# Patient Record
Sex: Female | Born: 1970 | Race: White | Hispanic: No | Marital: Married | Smoking: Former smoker
Health system: Southern US, Community
[De-identification: ages and names within clinical notes are randomized; demographics above are authoritative.]

## PROBLEM LIST (undated history)

## (undated) DIAGNOSIS — N939 Abnormal uterine and vaginal bleeding, unspecified: Secondary | ICD-10-CM

## (undated) DIAGNOSIS — Z973 Presence of spectacles and contact lenses: Secondary | ICD-10-CM

## (undated) HISTORY — PX: ABDOMINAL HYSTERECTOMY: SHX81

## (undated) HISTORY — PX: TUBAL LIGATION: SHX77

---

## 1992-07-20 HISTORY — PX: APPENDECTOMY: SHX54

## 2012-07-20 DIAGNOSIS — S82891A Other fracture of right lower leg, initial encounter for closed fracture: Secondary | ICD-10-CM

## 2012-07-20 HISTORY — DX: Other fracture of right lower leg, initial encounter for closed fracture: S82.891A

## 2012-07-20 HISTORY — PX: ANKLE FRACTURE SURGERY: SHX122

## 2020-01-08 LAB — CBC AND DIFFERENTIAL
HCT: 40 (ref 36–46)
Hemoglobin: 12.5 (ref 12.0–16.0)
Platelets: 264 (ref 150–399)
WBC: 5.2

## 2020-01-08 LAB — CBC: RBC: 4.33 (ref 3.87–5.11)

## 2020-01-08 LAB — TSH: TSH: 2.22 (ref 0.41–5.90)

## 2020-01-18 DIAGNOSIS — Z8616 Personal history of COVID-19: Secondary | ICD-10-CM

## 2020-01-18 HISTORY — DX: Personal history of COVID-19: Z86.16

## 2021-04-01 ENCOUNTER — Encounter: Payer: Self-pay | Admitting: Family Medicine

## 2021-04-01 ENCOUNTER — Ambulatory Visit: Payer: BC Managed Care – PPO | Admitting: Family Medicine

## 2021-04-01 ENCOUNTER — Other Ambulatory Visit: Payer: Self-pay

## 2021-04-01 VITALS — BP 122/72 | HR 76 | Temp 98.3°F | Ht 60.5 in | Wt 144.0 lb

## 2021-04-01 DIAGNOSIS — M533 Sacrococcygeal disorders, not elsewhere classified: Secondary | ICD-10-CM | POA: Diagnosis not present

## 2021-04-01 MED ORDER — CYCLOBENZAPRINE HCL 5 MG PO TABS: 5.0000 mg | ORAL_TABLET | Freq: Every evening | ORAL | 0 refills | Status: DC | PRN

## 2021-04-01 MED ORDER — DICLOFENAC SODIUM 75 MG PO TBEC: 75.0000 mg | DELAYED_RELEASE_TABLET | Freq: Two times a day (BID) | ORAL | 0 refills | Status: DC

## 2021-04-01 NOTE — Progress Notes (Signed)
Primary Care / Sports Medicine Office Visit  Patient Information:  Patient ID: Phyllis Smith, female DOB: March 08, 1971 Age: 50 y.o. MRN: [id number]   Phyllis Smith is a pleasant 50 y.o. 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   cyclobenzaprine (FLEXERIL) 5 MG tablet Take 1-2 tablets (5-10 mg total) by mouth at bedtime as needed for muscle spasms.   diclofenac (VOLTAREN) 75 MG EC tablet Take 1 tablet (75 mg total) by mouth 2 (two) times daily.   ibuprofen (ADVIL) 200 MG tablet Take 800 mg by mouth every 6 (six) hours as needed.   Probiotic Product (PROBIOTIC DAILY PO) Take 1 capsule by mouth daily.   No facility-administered encounter medications on file as of 04/01/2021.   Past Surgical History:  Procedure Laterality Date   ANKLE FRACTURE SURGERY Right 2014   APPENDECTOMY  1994   CESAREAN SECTION  2001   CESAREAN SECTION  2007    Vitals:   04/01/21 1439  BP: 122/72  Pulse: 76  Temp: 98.3 F (36.8 C)  SpO2: 97%   Vitals:   04/01/21 1439  Weight: 144 lb (65.3 kg)  Height: 5' 0.5" (1.537 m)   Body mass index is 27.66 kg/m.  No results found.   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  At onset she does describe both increased physical demands (caring for a family member and decreased regular athletic activity).  She does have a history of low back issues and posterior right hip issues with treatment inclusive of corticosteroid injection and what she localizes as the right sacroiliac joint.  She denies any symptoms traversing of the legs, no paresthesias other than in the gluteal region with prolonged sitting, no bowel/bladder change, and treatments have included activity modification, OTC analgesics.  Physical exam reveals patient in mild distress secondary to discomfort, standing, bilateral negative straight leg raise, mild tenderness right greater than left greater trochanters, right greater than left SI joints, maximal tenderness at the coccyx, nontender gluteus medius and piriformis locations, right side discomfort during FABER.  Her coccydynia can be secondary to repetitive minor trauma, coccygeal bone spurs, instability, degenerative changes, also considered secondary to spinal pathology though given her focal coccygeal tenderness, these can be considered secondary.  Given the persistence of symptoms, plan for dedicated films of the coccyx with stress views, lumbar spine, initiation of scheduled meloxicam, as needed cyclobenzaprine, and gentle activity as symptoms allow.  We will keep close follow-up in 2 weeks for clinical reevaluation and if suboptimal progress noted, escalation of pharmacotherapy, local injections can all be considered.  Once  able to do so, physical therapy recommended.   Orders & Medications Meds ordered this encounter  Medications   cyclobenzaprine (FLEXERIL) 5 MG tablet    Sig: Take 1-2 tablets (5-10 mg total) by mouth at bedtime as needed for muscle spasms.    Dispense:  28 tablet    Refill:  0   diclofenac (VOLTAREN) 75 MG EC tablet     Sig: Take 1 tablet (75 mg total) by mouth 2 (two) times daily.    Dispense:  30 tablet    Refill:  0   Orders Placed This Encounter  Procedures   DG Sacrum/Coccyx   DG Lumbar Spine Complete     Return in about 2 weeks (around 04/15/2021).     [name], MD   Primary Care Sports Medicine [place]

## 2021-04-01 NOTE — Patient Instructions (Signed)
-   Obtain x-rays with order provided - Start diclofenac and dose every a.m. and every p.m. scheduled (take with food, no other NSAIDs while this medication) - Can dose nightly 1-2 tablet cyclobenzaprine (muscle relaxer), side effect can include drowsiness - Continue to perform gentle activity and maintain motion throughout the day using low back/tailbone symptoms as a guide - Return for follow-up in 2 weeks, contact our office for any questions between now and then

## 2021-04-01 NOTE — Assessment & Plan Note (Signed)
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  At onset she does describe both increased physical demands (caring for a family member and decreased regular athletic activity).  She does have a history of low back issues and posterior right hip issues with treatment inclusive of corticosteroid injection and what she localizes as the right sacroiliac joint.  She denies any symptoms traversing of the legs, no paresthesias other than in the gluteal region with prolonged sitting, no bowel/bladder change, and treatments have included activity modification, OTC analgesics.  Physical exam reveals patient in mild distress secondary to discomfort, standing, bilateral negative straight leg raise, mild tenderness right greater than left greater trochanters, right greater than left SI joints, maximal tenderness at the coccyx, nontender gluteus medius and piriformis locations, right side discomfort during FABER.  Her coccydynia can be secondary to repetitive minor trauma, coccygeal bone spurs, instability, degenerative changes, also considered secondary to spinal pathology though given her focal coccygeal tenderness, these can be considered secondary.  Given the persistence of symptoms, plan for dedicated films of the coccyx with stress views, lumbar spine, initiation of scheduled meloxicam, as needed cyclobenzaprine, and gentle activity as symptoms allow.  We will keep close follow-up in 2 weeks for clinical reevaluation and if suboptimal progress noted, escalation of pharmacotherapy, local injections can all be considered.  Once able to do so, physical therapy recommended.

## 2021-04-03 ENCOUNTER — Other Ambulatory Visit: Payer: Self-pay

## 2021-04-03 ENCOUNTER — Ambulatory Visit
Admission: RE | Admit: 2021-04-03 | Discharge: 2021-04-03 | Disposition: A | Discharge status: Home / Self Care | Payer: BC Managed Care – PPO | Attending: Family Medicine | Admitting: Family Medicine

## 2021-04-03 ENCOUNTER — Ambulatory Visit
Admission: RE | Admit: 2021-04-03 | Discharge: 2021-04-03 | Disposition: A | Discharge status: Home / Self Care | Payer: BC Managed Care – PPO | Source: Ambulatory Visit | Attending: Family Medicine | Admitting: Family Medicine

## 2021-04-03 DIAGNOSIS — M545 Low back pain, unspecified: Secondary | ICD-10-CM | POA: Diagnosis not present

## 2021-04-03 DIAGNOSIS — M533 Sacrococcygeal disorders, not elsewhere classified: Secondary | ICD-10-CM | POA: Insufficient documentation

## 2021-04-03 DIAGNOSIS — S332XXA Dislocation of sacroiliac and sacrococcygeal joint, initial encounter: Secondary | ICD-10-CM | POA: Diagnosis not present

## 2021-04-03 DIAGNOSIS — M9914 Subluxation complex (vertebral) of sacral region: Secondary | ICD-10-CM | POA: Diagnosis not present

## 2021-04-03 NOTE — Progress Notes (Signed)
Please advise Ms. Septer that the x-rays do show wear and tear degenerative changes at the low back and some increased motion of the tailbone (coccyx) but not to the point of being abnormal but can still account for your symptoms. For now, continue with current plan outlined at initial visit and that I will see her at follow-up on 04/15/2021. Please have her reach out for questions and ask about MyChart access.

## 2021-04-15 ENCOUNTER — Other Ambulatory Visit: Payer: Self-pay

## 2021-04-15 ENCOUNTER — Ambulatory Visit: Payer: BC Managed Care – PPO | Admitting: Family Medicine

## 2021-04-15 ENCOUNTER — Encounter: Payer: Self-pay | Admitting: Family Medicine

## 2021-04-15 VITALS — BP 98/66 | HR 70 | Ht 60.5 in | Wt 146.0 lb

## 2021-04-15 DIAGNOSIS — M533 Sacrococcygeal disorders, not elsewhere classified: Secondary | ICD-10-CM | POA: Diagnosis not present

## 2021-04-15 DIAGNOSIS — M47817 Spondylosis without myelopathy or radiculopathy, lumbosacral region: Secondary | ICD-10-CM | POA: Diagnosis not present

## 2021-04-15 MED ORDER — DICLOFENAC SODIUM 75 MG PO TBEC: DELAYED_RELEASE_TABLET | ORAL | 0 refills | Status: DC

## 2021-04-15 NOTE — Progress Notes (Signed)
Primary Care / Sports Medicine Office Visit  Patient Information:  Patient ID: Phyllis Smith, female DOB: Mar 29, 1971 Age: 50 y.o. MRN: [id number]   Phyllis Smith is a pleasant 50 y.o. 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   cyclobenzaprine (FLEXERIL) 5 MG tablet Take 1-2 tablets (5-10 mg total) by mouth at bedtime as needed for muscle spasms.   Probiotic Product (ALIGN PO) Take 2 each by mouth daily.   [DISCONTINUED] diclofenac (VOLTAREN) 75 MG EC tablet Take 1 tablet (75 mg total) by mouth 2 (two) times daily.   diclofenac (VOLTAREN) 75 MG EC tablet Take 1 tablet (75 mg total) by mouth 2 (two) times daily for 14 days, THEN 1 tablet (75 mg total) 2 (two) times daily as needed for up to 14 days.   [DISCONTINUED] ibuprofen (ADVIL) 200 MG tablet Take 800 mg by mouth every 6 (six) hours as needed. (Patient not taking: Reported on 04/15/2021)   [DISCONTINUED] Probiotic Product (PROBIOTIC DAILY PO) Take 1 capsule by mouth daily.   No facility-administered encounter medications on file as of 04/15/2021.   Past Surgical History:  Procedure Laterality Date   ANKLE FRACTURE SURGERY Right 2014   APPENDECTOMY  1994   CESAREAN SECTION  2001   CESAREAN SECTION  2007    Vitals:   04/15/21 1322  BP: 98/66  Pulse: 70  SpO2: 98%   Vitals:   04/15/21 1322  Weight: 146  lb (66.2 kg)  Height: 5' 0.5" (1.537 m)   Body mass index is 28.04 kg/m.  DG Lumbar Spine Complete  Result Date: 04/03/2021 CLINICAL DATA:  Acute on chronic low back pain. EXAM: LUMBAR SPINE - COMPLETE 4+ VIEW COMPARISON:  None. FINDINGS: Five lumbar type vertebral bodies. No acute fracture or subluxation. Vertebral body heights are preserved. Trace anterolisthesis at L4-L5. Intervertebral disc spaces are maintained. Severe L4-L5 and moderate L5-S1 facet arthropathy. The sacroiliac joints are unremarkable. IMPRESSION: 1. 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 Electronically Signed   By: Titus Dubin M.D.   On: 04/03/2021 10:29   DG Sacrum/Coccyx  Result Date: 04/03/2021 CLINICAL DATA:  Chronic coccydynia.  Concern for hypermobility. EXAM: SACRUM AND COCCYX - 2+ VIEW COMPARISON:  None. FINDINGS: Mildly increased posterior subluxation of the distal coccyx when sitting compared to standing, measuring 18 degrees. No significant change in flexion of the distal coccyx when sitting. No acute fracture or dislocation. The sacroiliac joints and pubic symphysis are unremarkable. Bone mineralization is normal. Soft tissues are unremarkable. IMPRESSION: 1. Mildly increased posterior subluxation of the distal coccyx when sitting, measuring 18 degrees, which does not meet criteria for hypermobility (>25 degrees). 2. No acute osseous abnormality. Electronically Signed   By: Titus Dubin M.D.   On: 04/03/2021 10:27     Independent interpretation of notes and tests performed by another provider:   Independent interpretation of lumbar and  coccyx x-rays dated 04/03/2021 reveal increased posterior subluxation of the coccyx, lumbar films with facet hypertrophy at L4-5 and L5-S1, grade 1 L4-5 anterolisthesis, no acute osseous processes noted.  Procedures performed:   None  Pertinent History, Exam, Impression, and Recommendations:   Nontraumatic coccydynia Patient is noted interval improvement in her  symptoms following scheduled diclofenac and as needed cyclobenzaprine.  Recent x-rays do reveal motion throughout the coccyx without formal hypermobility, additionally lower lumbar facet arthropathy.  Given her interval improvement I have advised continued diclofenac x2 weeks then transition to as needed dosing, continue as needed dosing of cyclobenzaprine, formal physical therapy, and home exercises.  She is to return for reevaluation in 5 weeks, suboptimal progress noted, escalation of pharmacotherapy versus local ultrasound-guided injection to be considered.  Spondylosis of lumbosacral region without myelopathy or radiculopathy See additional assessment(s) for plan details.    Orders & Medications Meds ordered this encounter  Medications   diclofenac (VOLTAREN) 75 MG EC tablet    Sig: Take 1 tablet (75 mg total) by mouth 2 (two) times daily for 14 days, THEN 1 tablet (75 mg total) 2 (two) times daily as needed for up to 14 days.    Dispense:  60 tablet    Refill:  0   Orders Placed This Encounter  Procedures   Ambulatory referral to Physical Therapy     Return in about 5 weeks (around 05/20/2021) for f/u coccyx, annual physical.     [name], MD   [place]

## 2021-04-15 NOTE — Patient Instructions (Addendum)
-   Start physical therapy, referral coordinator will contact you for scheduling - Continue diclofenac twice daily x2 weeks, after 2 weeks can transition to as needed dosing - Can continue cyclobenzaprine on as-needed basis nightly - Return for follow-up in 5 weeks, contact us for questions between now and then  [facility] Physical Therapy:  [place]:  [phone]

## 2021-04-15 NOTE — Assessment & Plan Note (Signed)
Patient is noted interval improvement in her symptoms following scheduled diclofenac and as needed cyclobenzaprine.  Recent x-rays do reveal motion throughout the coccyx without formal hypermobility, additionally lower lumbar facet arthropathy.  Given her interval improvement I have advised continued diclofenac x2 weeks then transition to as needed dosing, continue as needed dosing of cyclobenzaprine, formal physical therapy, and home exercises.  She is to return for reevaluation in 5 weeks, suboptimal progress noted, escalation of pharmacotherapy versus local ultrasound-guided injection to be considered.

## 2021-04-15 NOTE — Assessment & Plan Note (Signed)
See additional assessment(s) for plan details. 

## 2021-04-22 DIAGNOSIS — D259 Leiomyoma of uterus, unspecified: Secondary | ICD-10-CM | POA: Diagnosis not present

## 2021-04-22 DIAGNOSIS — N939 Abnormal uterine and vaginal bleeding, unspecified: Secondary | ICD-10-CM | POA: Diagnosis not present

## 2021-04-29 ENCOUNTER — Encounter: Payer: Self-pay | Admitting: Physical Therapy

## 2021-04-29 ENCOUNTER — Ambulatory Visit: Payer: BC Managed Care – PPO | Attending: Family Medicine | Admitting: Physical Therapy

## 2021-04-29 DIAGNOSIS — G8929 Other chronic pain: Secondary | ICD-10-CM | POA: Diagnosis not present

## 2021-04-29 DIAGNOSIS — M533 Sacrococcygeal disorders, not elsewhere classified: Secondary | ICD-10-CM | POA: Diagnosis not present

## 2021-04-29 DIAGNOSIS — M545 Low back pain, unspecified: Secondary | ICD-10-CM | POA: Insufficient documentation

## 2021-04-29 NOTE — Therapy (Signed)
[facility] [address] [place], [address] Phone: [phone]   Fax:  [phone]  Physical Therapy Evaluation  Patient Details  Name: Phyllis Smith MRN: [id number] Date of Birth: 03-24-1971 Referring Provider (PT): [name], MD  Encounter Date: 04/29/2021   PT End of Session - 04/29/21 1026     Visit Number 1    Number of Visits 24    Date for PT Re-Evaluation 07/22/21    Authorization Type [employer] reporting from 04/29/2021    Authorization Time Period 30 PT/OT/ST per cal yr    Authorization - Visit Number 1    Authorization - Number of Visits 30    Progress Note Due on Visit 10    PT Start Time 0903    PT Stop Time 1000    PT Time Calculation (min) 57 min    Activity Tolerance Patient tolerated treatment well    Behavior During Therapy [facility] for tasks assessed/performed             Past Medical History:  Diagnosis Date   Broken ankle, right, closed, initial encounter 2014    Past Surgical History:  Procedure Laterality Date   ANKLE FRACTURE SURGERY Right 2014   [place]  2007    There were no vitals filed for this visit.    Subjective Assessment - 04/29/21 0914     Subjective Patient states she doesn't know how her condition started. She used to be super active (volleyball, snow skiing, body step aerobics, Programme researcher, broadcasting/film/video). She also had a super active job at [employer]. She moved and got a different job and [place] hit so she wasn't able to go to the gym. She has always had some low back pain and a bit of a pinch at her right neck.Her tailbone just started feeling numb at first and then she started getting sharp pains in her coccyx when she stands up. She got a TM desk (goes up to 2 mph). She felt that this may have helped accommodate the pain but it is still causing problems when she stopped. She was given some  medication that has made it better and she has been able to return to exercising. She states she still avoids things like russian twists. She went mountain bike riding last week and was okay for about 20 min (she was able to go 20 more min). She still has pain and limitations but it is much better after the medication. She does not want to have to take the medication forever. She states she has fallen on her tailbone "a million times" over her lifetime but nothing recent. She states her low back pain has gotten worse but it does seem separate from her coccyx pain for her. She denies any radiation down the legs. Denies numbness tingling except initially numbness over her tailbone. Coccyx pain does not wake her. She had a steroid injection to the low back (S1? Per patient), which helped for a couple of months. Would like guidance on what to do. States there is a lot of info on instagram but she doesn't know what to follow. Feels limited in exercise. She has 2 large dogs.   She has very irregular periods. Is getting a pelvic ultrasound about 2 weeks from now. Denies leaking of urine of bowel or difficulty with voiding/defecating. Denies pain with sex.    Pertinent  History Patient is a 50 y.o. female who presents to outpatient physical therapy with a referral for medical diagnosis nontraumatic coccydynia, spondylosis of lumbosacral region without myelopathy or radiculopathy. This patient's chief complaints consist of pain at the coccyx and low back pain R > L that radiates into her glutes leading to the following functional deficits: difficulty with sitting in kayak for more than 10 min, russian twists, mountain bike for more than 20 min, sleeping, prolonged sitting, prolonged standing/walking,  working in seated position, getting up after sitting, cardio, jumping, etc. Patient reports decreased quality of life and 25# weight gain that she attributes to her impairments and functional deficits.   Relevant past medical  history and comorbidities include former smoker, prior ankle fracture with surgery R (2014).  Patient denies hx of cancer, stroke, seizures, lung problem, major cardiac events, diabetes, unexplained weight loss, new onset stumbling or dropping things, osteoporosis, spinal surgery.    Limitations Sitting;Standing;Walking;House hold activities;Other (comment)   sitting in kayak, russian twists, mountain bike, sleeping, prolonged sitting, prolonged standing/walking,  working in seated position, getting up after sitting, cardio, jumping, etc. Patient reports decreased quality of life and 25# weight gain   Diagnostic tests Sacrum/Coccyx xray report 04/03/2021: "IMPRESSION:  1. Mildly increased posterior subluxation of the distal coccyx when  sitting, measuring 18 degrees, which does not meet criteria for  hypermobility (>25 degrees).  2. No acute osseous abnormality."  Lumbar xray report 04/03/2021: "IMPRESSION:  1. Lower lumbar facet arthropathy, severe at L4-L5."    Patient Stated Goals Want better mobility, to learn what to do when it does hurt    Currently in Pain? Yes    Pain Score 3    Coccyx: C: 3/10, W: 8/10, B: 2-3/10; Low back: C: 3-4/10, W: 7/10, B: 2/10.   Pain Location Other (Comment)   coccyx and bilateral buttocks/glutes and posterolateral trunk (R > L for low back pain)   Pain Orientation Right;Left;Mid;Lower    Pain Descriptors / Indicators Other (Comment)   Coccyx: numbnes, sharp pain; low back: dull pain, constant   Pain Type Chronic pain    Pain Radiating Towards does not ratiate    Pain Onset More than a month ago    Pain Frequency --   coccyx: intermittant, low back constant   Aggravating Factors  Coccyx: prolonged sitting, when she goes to sit up, laying on back, riding bike, kayaking, any pressure on it. Low back: when she wakes up and by end of day after usual activities, prolonged standing. Both: jumping.    Pain Relieving Factors Coccyx: standing, getting off of it, medication. Low  back: sitting down, heating pad, steroid shot (for 2 months).    Effect of Pain on Daily Activities Functional Limitations: sitting in kayak for more than 10 min, russian twists, mountain bike for more than 20 min, sleeping, prolonged sitting, prolonged standing/walking,  working in seated position, getting up after sitting, cardio, jumping, etc. Patient reports decreased quality of life and 25# weight gain that she attributes to her impairments and functional deficits.              [facility] PT Assessment - 04/29/21 0001       Assessment   Medical Diagnosis nontraumatic coccydynia, spondylosis of lumbosacral region without myelopathy or radiculopathy    Referring Provider (PT) [name], MD    Onset Date/Surgical Date --   coccyx has been really bad the last year, low back more chronic   Prior Therapy none for this  problem prior to current episode of care      Precautions   Precautions None      Balance Screen   Has the patient fallen in the past 6 months No    Has the patient had a decrease in activity level because of a fear of falling?  No    Is the patient reluctant to leave their home because of a fear of falling?  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 Shifts around frequently.  Posture  (standing): increased lumbar lordosis.  Anthropometrics Tremor: none Body composition: WFL Muscle bulk: WFL Edema: none Functional Mobility Bed mobility: supine <> sit and rolling I with some modifications to avoid pressure to coccyx Transfers: sit <> stand mod I for increased time due to pain with release of pressure to coccyx.  Gait: grossly WFL for household and short community ambulation. More detailed gait analysis deferred to later date as needed.  SPINE MOTION Lumbar Spine AROM Flexion: fingers to floor, normal range, no pain Extension: 50%, no pain Side Flexion:   R WFL  L range WFL, right sided low back pain Rotation:  R 75% L 75%  PERIPHERAL JOINT MOTION (in degrees) Passive Range of Motion (PROM) B LE WFL, hip flexion, IR, ER trending towards hypermobile  MUSCLE PERFORMANCE (MMT):  *Indicates pain 04/29/21 Date Date  Joint/Motion R/L R/L R/L  Hip     Flexion (L1, L2) 4+/5 / /  Extension (knee ext) 4+*/5* / /  Extension (knee flex) 4*/4* / /  Abduction 5/5 / /  Adduction 4/4+ / /  External rotation (prone) 4/4+ / /  Internal rotation (prone) 4/4+ / /  Comments:  04/29/21: B knee and ankle 5/5. able to heel and toe walk with no UE support. Pain near right ischial tuberosity with R hip adduction. Pain with hip extension at ipsilateral glute.   SPECIAL TESTS:  LOWER LIMB NEURODYNAMICS Straight Leg Raise (Sciatic nerve)  R  = negative  L  = negative  HIP SPECIAL TESTS FABER: R = negative, L = negative.  SIJ SPECIAL TESTS SIJ distraction: R = negative, L = negative. SIJ compression: R = negative, L = negative. Thigh thrust: R = deferred, L = deferred. Sacral thrust: R = negative, L = negative  ACCESSORY MOTION:  CPA at sacrum reproduces coccyx pain mildly.  CPA at coccyx strongly reproduce coccyx pain.   PALPATION: No reproduction of pain with palpation to B SIJ.  Mildly tender throughout L posterior hips/glutes and bilateral lumbar paraspinals, QL,  and glute med region.  Concordant tenderness for "low back pain" in right glute region near sciatic notch and deep external hip rotators.  Strong concordant coccyx pain with palpation near and  over coccyx. No concordant pain on medial side of ischium bilaterally.     REPEATED MOTIONS TESTING: Prone press up 1x10, no significant change in symptoms.   Objective measurements completed on examination: See above findings.    TREATMENT:   Therapeutic exercise: to centralize symptoms and improve ROM, strength, muscular endurance, and activity tolerance required for successful completion of functional activities.  - prone press up 1x10 - quadruped rock back 1x5, with diagonal flair, 1x5 each side.  - Education on diagnosis, prognosis, POC, anatomy and physiology of current condition.  - education on coccyx cushion for sitting  Pt required multimodal cuing for proper technique and to facilitate improved neuromuscular control, strength, range of motion, and functional ability resulting in improved performance and form.  HOME EXERCISE PROGRAM Access Code: [email or web address] URL: [email or web address] Date: 04/29/2021 Prepared by: Rosita Kea  Exercises Prone Press Up - 4 x daily - 1 sets - 15 reps  Verbally delivered:  [email or web address] [place] back -  Repeat 10 Times, Hold 5 Seconds, Complete 1 Set, Perform 2 Times a Day    PT Education - 04/29/21 1026     Education Details Exercise purpose/form. Self management techniques. Education on diagnosis, prognosis, POC, anatomy and physiology of current condition Education on HEP including handout    Person(s) Educated Patient    Methods Explanation;Demonstration;Tactile cues;Verbal cues;Handout    Comprehension Verbalized understanding;Returned demonstration;Verbal cues required;Tactile cues required;Need further instruction              PT Short Term Goals - 04/29/21 1031       PT SHORT TERM GOAL #1   Title Be  independent with initial home exercise program for self-management of symptoms.    Baseline initial HEP provided at IE (04/29/2021);    Time 2    Period Weeks    Status New    Target Date 05/13/21               PT Long Term Goals - 04/29/21 1032       PT LONG TERM GOAL #1   Title Be independent with a long-term home exercise program for self-management of symptoms.    Baseline Initial HEP provided at IE (04/29/2021);    Time 12    Period Weeks    Status New   TARGET DATE FOR ALL LONG TERM GOALS: 07/22/2021     PT LONG TERM GOAL #2   Title Demonstrate improved FOTO to equal or greater than 70 by visit #10 to demonstrate improvement in overall condition and self-reported functional ability.    Baseline 57 (04/29/2021);    Time 12    Period Weeks    Status New      PT LONG TERM GOAL #3   Title Reduce pain with functional activities to equal or less than 3/10 to allow patient to complete usual activities including exercising, walking, and sitting  with less difficulty.    Baseline 8/10 (04/29/21);    Time 12    Period Weeks    Status New      PT LONG TERM GOAL #4   Title Improve B hip strength to equal or greater than 4+/5 with no increase in pain to improve patient's ability to complete functional tasks such as exercising, sitting, and jumping with less difficulty.    Baseline some movements painful and as weak as 4/5 (04/29/2021);    Time 12    Period Weeks    Status New  PT LONG TERM GOAL #5   Title Complete community, work and/or recreational activities without limitation due to current condition    Baseline Functional Limitations: sitting in kayak for more than 10 min, russian twists, mountain bike for more than 20 min, sleeping, prolonged sitting, prolonged standing/walking,  working in seated position, getting up after sitting, cardio, jumping,  (04/29/2021);    Time 12    Period Weeks    Status New                    Plan - 04/29/21 1041      Clinical Impression Statement Patient is a 50 y.o. female referred to outpatient physical therapy with a medical diagnosis of nontraumatic coccydynia, spondylosis of lumbosacral region without myelopathy or radiculopathy who presents with signs and symptoms consistent with chronic coccydynia and low back pain with likely somatic radiation to the R > L buttocks. Screening for pelvic floor cause of coccydynia negative today but patient may benefit from strengthening/motor control exercise for pelvic floor to improve pain at coccyx. Patient presents with significant pain, muscle tension, motor control, posture, joint stiffness, ROM, muscle performance (strength/power/endurance) and activity tolerance impairments that are limiting ability to complete her usual activities including sitting in kayak, russian twists, mountain bike, sleeping, prolonged sitting, prolonged standing/walking,  working in seated position, getting up after sitting, cardio, jumping, etc without difficulty. These difficulties decrease patient's quality of life and she reports they have lead to 25# weight gain. Patient will benefit from skilled physical therapy intervention to address current body structure impairments and activity limitations to improve function and work towards goals set in current POC in order to return to prior level of function or maximal functional improvement.    Personal Factors and Comorbidities Comorbidity 3+;Past/Current Experience;Time since onset of injury/illness/exacerbation    Comorbidities former smoker, prior ankle fracture with surgery R (2014), right neck pain.    Examination-Activity Limitations Bed Mobility;Squat;Locomotion Level;Stand;Transfers;Sit;Sleep    Examination-Participation Restrictions Driving;Occupation;Community Activity;Interpersonal Relationship;Other   sitting in kayak, russian twists, mountain bike, sleeping, prolonged sitting, prolonged standing/walking,  working in seated position,  getting up after sitting, cardio, jumping, etc. Patient reports decreased quality of life and 25# weight gain   Stability/Clinical Decision Making Stable/Uncomplicated    Clinical Decision Making Low    Rehab Potential Good    PT Frequency 2x / week    PT Duration 12 weeks    PT Treatment/Interventions ADLs/Self Care Home Management;Aquatic Therapy;Cryotherapy;Moist Heat;Electrical Stimulation;Manual techniques;Dry needling;Passive range of motion;Neuromuscular re-education;Therapeutic exercise;Therapeutic activities;Patient/family education;Spinal Manipulations;Joint Manipulations    PT Next Visit Plan update HEP as appropriate, core, hip, and funcitonal strengthening as tolerated.    PT Home Exercise Plan Medbridge Access Code: [email or web address]               [email or web address]    Consulted and Agree with Plan of Care Patient             Patient will benefit from skilled therapeutic intervention in order to improve the following deficits and impairments:  Pain, Impaired sensation, Decreased mobility, Increased muscle spasms, Decreased activity tolerance, Decreased endurance, Decreased range of motion, Decreased strength, Hypomobility, Impaired perceived functional ability, Difficulty walking  Visit Diagnosis: Sacrococcygeal disorders, not elsewhere classified  Chronic bilateral low back pain without sciatica     Problem List Patient Active Problem List   Diagnosis Date Noted   Spondylosis of lumbosacral region without myelopathy or radiculopathy 04/15/2021   Nontraumatic coccydynia 04/01/2021   Everlean Alstrom. Graylon Good, PT,  DPT 04/29/21, 10:45 AM  [facility] [address] [place], [address] Phone: [phone]   Fax:  [phone]  Name: Shirlette Scarber MRN: [id number] Date of Birth: Jan 09, 1971

## 2021-05-01 ENCOUNTER — Telehealth: Payer: Self-pay | Admitting: Physical Therapy

## 2021-05-01 ENCOUNTER — Ambulatory Visit: Payer: BC Managed Care – PPO | Admitting: Physical Therapy

## 2021-05-01 NOTE — Telephone Encounter (Signed)
Called patient when she did not show up for her appointment scheduled at 9am today. Patient answered and stated she did not realize she had an appointment today. She checked her printed schedule and confirmed she would come to next scheduled appt Tues 05/06/2021 at 9:00am. She was unable to reschedule to another open appointment time today.   Everlean Alstrom. Graylon Good, PT, DPT 05/01/21, 9:46 AM

## 2021-05-06 ENCOUNTER — Encounter: Payer: Self-pay | Admitting: Physical Therapy

## 2021-05-06 ENCOUNTER — Ambulatory Visit: Payer: BC Managed Care – PPO | Admitting: Physical Therapy

## 2021-05-06 DIAGNOSIS — M533 Sacrococcygeal disorders, not elsewhere classified: Secondary | ICD-10-CM | POA: Diagnosis not present

## 2021-05-06 DIAGNOSIS — G8929 Other chronic pain: Secondary | ICD-10-CM

## 2021-05-06 DIAGNOSIS — M545 Low back pain, unspecified: Secondary | ICD-10-CM | POA: Diagnosis not present

## 2021-05-06 NOTE — Therapy (Signed)
[facility] [address], [place], [address] Phone: [phone]   Fax:  [phone]  Physical Therapy Treatment  Patient Details  Name: Phyllis Smith MRN: [id number] Date of Birth: 1971-02-20 Referring Provider (PT): [name], MD   Encounter Date: 05/06/2021   PT End of Session - 05/06/21 1133     Visit Number 2    Number of Visits 24    Date for PT Re-Evaluation 07/22/21    Authorization Type BLUE [employer] reporting from 04/29/2021    Authorization Time Period 30 PT/OT/ST per cal yr    Authorization - Visit Number 2    Authorization - Number of Visits 30    Progress Note Due on Visit 10    PT Start Time 0900    PT Stop Time 0945    PT Time Calculation (min) 45 min    Activity Tolerance Patient tolerated treatment well    Behavior During Therapy [facility] for tasks assessed/performed             Past Medical History:  Diagnosis Date   Broken ankle, right, closed, initial encounter 2014    Past Surgical History:  Procedure Laterality Date   ANKLE FRACTURE SURGERY Right 2014   [place]  2007    There were no vitals filed for this visit.   Subjective Assessment - 05/06/21 0903     Subjective Patient reports her coccyx feels about the same. States she is not sure if she is doing the "butt in the air" exercise and cannot tell that it is helping. States the prone press up is helpful to the back pain at least temporarily and she does it about 1 x a day. Reports current pain 3/10 over the coccyx. She denies leaking if she sneezes, jumps, etc. She denies repeated trips to the bathroom or multiple trips at night to urinate.    Pertinent History Patient is a 50 y.o. female who presents to outpatient physical therapy with a referral for medical diagnosis nontraumatic coccydynia, spondylosis of lumbosacral region without myelopathy or  radiculopathy. This patient's chief complaints consist of pain at the coccyx and low back pain R > L that radiates into her glutes leading to the following functional deficits: difficulty with sitting in kayak for more than 10 min, russian twists, mountain bike for more than 20 min, sleeping, prolonged sitting, prolonged standing/walking,  working in seated position, getting up after sitting, cardio, jumping, etc. Patient reports decreased quality of life and 25# weight gain that she attributes to her impairments and functional deficits.   Relevant past medical history and comorbidities include former smoker, prior ankle fracture with surgery R (2014).  Patient denies hx of cancer, stroke, seizures, lung problem, major cardiac events, diabetes, unexplained weight loss, new onset stumbling or dropping things, osteoporosis, spinal surgery.    Limitations Sitting;Standing;Walking;House hold activities;Other (comment)   sitting in kayak, russian twists, mountain bike, sleeping, prolonged sitting, prolonged standing/walking,  working in seated position, getting up after sitting, cardio, jumping, etc. Patient reports decreased quality of life and 25# weight gain   Diagnostic tests Sacrum/Coccyx xray report 04/03/2021: "IMPRESSION:  1. Mildly increased posterior subluxation of the distal coccyx when  sitting, measuring 18 degrees, which does not meet criteria for  hypermobility (>25 degrees).  2. No acute osseous abnormality."  Lumbar xray report 04/03/2021: "IMPRESSION:  1. Lower lumbar facet arthropathy, severe  at L4-L5."    Patient Stated Goals Want better mobility, to learn what to do when it does hurt    Pain Score 3     Pain Onset More than a month ago               TREATMENT:    Therapeutic exercise: to centralize symptoms and improve ROM, strength, muscular endurance, and activity tolerance required for successful completion of functional activities.  - education on role of pelvic floor, relationship  to core, diaphragm - education on sitting postures and use of coccyx cushion with visual examples.  - education on graded exposure to sitting.  - Quadruped bird dog (alternating shoulder flexion/contralateral hip extension with core muscles braced). Cuing for abdominal brace and to stabilize core. 2x10 each side with instruction on how to progress with UE/LE circles.  - sidelying diaphragmatic breathing with hand on chest and umbilicus to improve form, 1x10 breaths (pt improved with cuing but does have difficulty with coordination).  - standing diaphragmatic breathing with hand on chest and umbilicus to improve form, 1x10 breaths (pt improved with cuing but does have difficulty with coordination).  - seated figure 4 glute stretch, 1x5 seconds each side (Feels good) - sidelying left reverse clam shell, 1x10 to learn how to perform.  - quadruped hip hinge rock back with hips in slight IR to stretch pelvic floor and hips, 1x10 to learn technique (does not report pain at anterior hips).  - Education on HEP including handout    Manual therapy: to reduce pain and tissue tension, improve range of motion, neuromodulation, in order to promote improved ability to complete functional activities. PRONE with face in table cradle, pillow under ankles - CPA grade III-IV with KE wedge 10-30 reps per segment, T10-L5.  - STM to right deep hip external rotators and glutes with trigger point release   Pt required multimodal cuing for proper technique and to facilitate improved neuromuscular control, strength, range of motion, and functional ability resulting in improved performance and form.   HOME EXERCISE PROGRAM Access Code: [email or web address] URL: [email or web address] Date: 05/06/2021 Prepared by: Rosita Kea  Exercises Prone Press Up - 4 x daily - 1 sets - 15 reps Sidelying Diaphragmatic Breathing - 1-2 x daily - 20 reps Standing Diaphragmatic Breathing - 1-2 x daily - 20 reps Hip Hinge Rock Back  - 1-2 x daily - 1 sets - 20 reps - 5 seconds hold Bird Dog - 1 x daily - 3 sets - 10 reps Seated Hip External Rotation Stretch - 1 x daily - 3 sets - 30 seconds hold Sidelying Reverse Clamshell - 1 x daily - 3 sets - 10 reps     PT Education - 05/06/21 1132     Education Details - education on role of pelvic floor, relationship to core, diaphragm  - education on sitting postures and use of coccyx cushion with visual examples.   - education on graded exposure to sitting. - Education on HEP including handout.  Educated on return to functional activity and meaning of pain with movement in regards in when to stop or continue. Education on diagnosis, prognosis, POC, anatomy and physiology of current condition    Person(s) Educated Patient    Methods Explanation;Demonstration;Tactile cues;Verbal cues;Handout    Comprehension Verbalized understanding;Returned demonstration;Verbal cues required;Tactile cues required;Need further instruction              PT Short Term Goals - 04/29/21 1031       PT SHORT TERM  GOAL #1   Title Be independent with initial home exercise program for self-management of symptoms.    Baseline initial HEP provided at IE (04/29/2021);    Time 2    Period Weeks    Status New    Target Date 05/13/21               PT Long Term Goals - 04/29/21 1032       PT LONG TERM GOAL #1   Title Be independent with a long-term home exercise program for self-management of symptoms.    Baseline Initial HEP provided at IE (04/29/2021);    Time 12    Period Weeks    Status New   TARGET DATE FOR ALL LONG TERM GOALS: 07/22/2021     PT LONG TERM GOAL #2   Title Demonstrate improved FOTO to equal or greater than 70 by visit #10 to demonstrate improvement in overall condition and self-reported functional ability.    Baseline 57 (04/29/2021);    Time 12    Period Weeks    Status New      PT LONG TERM GOAL #3   Title Reduce pain with functional activities to equal or less  than 3/10 to allow patient to complete usual activities including exercising, walking, and sitting  with less difficulty.    Baseline 8/10 (04/29/21);    Time 12    Period Weeks    Status New      PT LONG TERM GOAL #4   Title Improve B hip strength to equal or greater than 4+/5 with no increase in pain to improve patient's ability to complete functional tasks such as exercising, sitting, and jumping with less difficulty.    Baseline some movements painful and as weak as 4/5 (04/29/2021);    Time 12    Period Weeks    Status New      PT LONG TERM GOAL #5   Title Complete community, work and/or recreational activities without limitation due to current condition    Baseline Functional Limitations: sitting in kayak for more than 10 min, russian twists, mountain bike for more than 20 min, sleeping, prolonged sitting, prolonged standing/walking,  working in seated position, getting up after sitting, cardio, jumping,  (04/29/2021);    Time 12    Period Weeks    Status New                   Plan - 05/06/21 1131     Clinical Impression Statement Patient tolerated treatment well overall with no increase in pain by end of session. Patient continues to have tenderness/tightness at right deep hip external rotators to palpation. May benefit from dry needling to this region next session (educated on technique this session). Patient introduced to diaphragmatic breathing with plan to progress to coordinating with with pelvic floor drop/relaxation to decrease strain on coccyx form pelvic floor. Sitting modifications discussed and pt educated on graded approach to improving sitting tolerance. Educated on return to functional activity and meaning of pain with movement in regards in when to stop or continue. HEP updated with well tolerated exercises that help address back and coccyx pain. Patient would benefit from continued management of limiting condition by skilled physical therapist to address  remaining impairments and functional limitations to work towards stated goals and return to PLOF or maximal functional independence.    Personal Factors and Comorbidities Comorbidity 3+;Past/Current Experience;Time since onset of injury/illness/exacerbation    Comorbidities former smoker, prior ankle fracture with surgery  R (2014), right neck pain.    Examination-Activity Limitations Bed Mobility;Squat;Locomotion Level;Stand;Transfers;Sit;Sleep    Examination-Participation Restrictions Driving;Occupation;Community Activity;Interpersonal Relationship;Other   sitting in kayak, russian twists, mountain bike, sleeping, prolonged sitting, prolonged standing/walking,  working in seated position, getting up after sitting, cardio, jumping, etc. Patient reports decreased quality of life and 25# weight gain   Stability/Clinical Decision Making Stable/Uncomplicated    Rehab Potential Good    PT Frequency 2x / week    PT Duration 12 weeks    PT Treatment/Interventions ADLs/Self Care Home Management;Aquatic Therapy;Cryotherapy;Moist Heat;Electrical Stimulation;Manual techniques;Dry needling;Passive range of motion;Neuromuscular re-education;Therapeutic exercise;Therapeutic activities;Patient/family education;Spinal Manipulations;Joint Manipulations    PT Next Visit Plan update HEP as appropriate, core, hip, and funcitonal strengthening as tolerated. consider dry needling and manual    PT Home Exercise Plan Medbridge Access Code: [email or web address]               [email or web address]    Consulted and Agree with Plan of Care Patient             Patient will benefit from skilled therapeutic intervention in order to improve the following deficits and impairments:  Pain, Impaired sensation, Decreased mobility, Increased muscle spasms, Decreased activity tolerance, Decreased endurance, Decreased range of motion, Decreased strength, Hypomobility, Impaired perceived functional ability, Difficulty walking  Visit  Diagnosis: Sacrococcygeal disorders, not elsewhere classified  Chronic bilateral low back pain without sciatica     Problem List Patient Active Problem List   Diagnosis Date Noted   Spondylosis of lumbosacral region without myelopathy or radiculopathy 04/15/2021   Nontraumatic coccydynia 04/01/2021    Everlean Alstrom. Graylon Good, PT, DPT 05/06/21, 11:34 AM  [facility] [address], [place], [address] Phone: [phone]   Fax:  [phone]  Name: Phyllis Smith MRN: [id number] Date of Birth: 12-May-1971

## 2021-05-07 ENCOUNTER — Ambulatory Visit: Payer: Self-pay | Admitting: Internal Medicine

## 2021-05-08 ENCOUNTER — Encounter: Payer: BC Managed Care – PPO | Admitting: Physical Therapy

## 2021-05-13 ENCOUNTER — Encounter: Payer: BC Managed Care – PPO | Admitting: Physical Therapy

## 2021-05-15 ENCOUNTER — Encounter: Payer: BC Managed Care – PPO | Admitting: Physical Therapy

## 2021-05-15 DIAGNOSIS — R9389 Abnormal findings on diagnostic imaging of other specified body structures: Secondary | ICD-10-CM | POA: Diagnosis not present

## 2021-05-15 DIAGNOSIS — N939 Abnormal uterine and vaginal bleeding, unspecified: Secondary | ICD-10-CM | POA: Diagnosis not present

## 2021-05-15 DIAGNOSIS — N9489 Other specified conditions associated with female genital organs and menstrual cycle: Secondary | ICD-10-CM | POA: Diagnosis not present

## 2021-05-15 DIAGNOSIS — N858 Other specified noninflammatory disorders of uterus: Secondary | ICD-10-CM | POA: Diagnosis not present

## 2021-05-15 DIAGNOSIS — D259 Leiomyoma of uterus, unspecified: Secondary | ICD-10-CM | POA: Diagnosis not present

## 2021-05-15 HISTORY — PX: ENDOMETRIAL BIOPSY: SHX622

## 2021-05-19 ENCOUNTER — Ambulatory Visit: Payer: BC Managed Care – PPO | Admitting: Physical Therapy

## 2021-05-19 ENCOUNTER — Encounter: Payer: Self-pay | Admitting: Physical Therapy

## 2021-05-19 DIAGNOSIS — G8929 Other chronic pain: Secondary | ICD-10-CM | POA: Diagnosis not present

## 2021-05-19 DIAGNOSIS — M533 Sacrococcygeal disorders, not elsewhere classified: Secondary | ICD-10-CM | POA: Diagnosis not present

## 2021-05-19 DIAGNOSIS — M545 Low back pain, unspecified: Secondary | ICD-10-CM | POA: Diagnosis not present

## 2021-05-19 NOTE — Therapy (Signed)
[facility] [address] [place], [address] Phone: [phone]   Fax:  [phone]  Physical Therapy Treatment  Patient Details  Name: Phyllis Smith MRN: [id number] Date of Birth: October 01, 1970 Referring Provider (PT): [name], MD   Encounter Date: 05/19/2021   PT End of Session - 05/19/21 1335     Visit Number 3    Number of Visits 24    Date for PT Re-Evaluation 07/22/21    Authorization Type BL[employer]IELD reporting from 04/29/2021    Authorization Time Period 30 PT/OT/ST per cal yr    Authorization - Visit Number 3    Authorization - Number of Visits 30    Progress Note Due on Visit 10    PT Start Time 0902    PT Stop Time 0942    PT Time Calculation (min) 40 min    Activity Tolerance Patient tolerated treatment well    Behavior During Therapy [facility] for tasks assessed/performed             Past Medical History:  Diagnosis Date   Broken ankle, right, closed, initial encounter 2014    Past Surgical History:  Procedure Laterality Date   ANKLE FRACTURE SURGERY Right 2014   [place]  2007    There were no vitals filed for this visit.   Subjective Assessment - 05/19/21 0902     Subjective Pateint reports her coccyx pain is about the same. She went on a trip and caught a cold and tested negative at home for [place] but she stayed away from PT just in case. Her trip was emotionally stressfull and she thinks she just got worn down. She worked out with a [employer] video on Saturday and she feels "good sore." She tolerated the workout well as long as she is standing. She found the dipahragmatic breathing difficult. She went on a hike and tried to do diaphragmatic breathing but felt she was doing it wrong. Reports current pain in the coccyx 2/10 when she is sitting in the car (feels it but doesn't feel like she needs to move). She  is still taking the anti-inflammatory. Has not tried the coccyx cushion. Felt buttocks was tender where manual therapy was performed and felt like it was bruised. Cannot tell if manual therapy did or did not help her coccyx.    Pertinent History Patient is a 50 y.o. female who presents to outpatient physical therapy with a referral for medical diagnosis nontraumatic coccydynia, spondylosis of lumbosacral region without myelopathy or radiculopathy. This patient's chief complaints consist of pain at the coccyx and low back pain R > L that radiates into her glutes leading to the following functional deficits: difficulty with sitting in kayak for more than 10 min, russian twists, mountain bike for more than 20 min, sleeping, prolonged sitting, prolonged standing/walking,  working in seated position, getting up after sitting, cardio, jumping, etc. Patient reports decreased quality of life and 25# weight gain that she attributes to her impairments and functional deficits.   Relevant past medical history and comorbidities include former smoker, prior ankle fracture with surgery R (2014).  Patient denies hx of cancer, stroke, seizures, lung problem, major cardiac events, diabetes, unexplained weight loss, new onset stumbling or dropping things, osteoporosis, spinal surgery.    Limitations Sitting;Standing;Walking;House hold activities;Other (comment)   sitting in kayak, russian twists, mountain bike, sleeping, prolonged sitting,  prolonged standing/walking,  working in seated position, getting up after sitting, cardio, jumping, etc. Patient reports decreased quality of life and 25# weight gain   Diagnostic tests Sacrum/Coccyx xray report 04/03/2021: "IMPRESSION:  1. Mildly increased posterior subluxation of the distal coccyx when  sitting, measuring 18 degrees, which does not meet criteria for  hypermobility (>25 degrees).  2. No acute osseous abnormality."  Lumbar xray report 04/03/2021: "IMPRESSION:  1. Lower lumbar facet  arthropathy, severe at L4-L5."    Patient Stated Goals Want better mobility, to learn what to do when it does hurt    Currently in Pain? Yes    Pain Score 2     Pain Onset More than a month ago              TREATMENT:    Therapeutic exercise: to centralize symptoms and improve ROM, strength, muscular endurance, and activity tolerance required for successful completion of functional activities.  - discussed use of dry needling for condition (pt elects to think about it).  - standing diaphragmatic breathing with hand on chest and umbilicus to improve form, 1x10 breaths  - quadruped diaphragmatic breathing, 1x10 breaths (attempted with rock back but unable to coordinate).  - modified hip airplanes with hands on hips and toe on floor, 2x10 each side.  - lateral stepping with GTB around ankles, 1x30 feet each direction - standing lumbar extension, 1x10 - forward/backwards monster walks, with GTB around ankles 1x30 feet each direction - standing pallof press, 2x10 each side with GTB (medium).  - Education on HEP including handout    Pt required multimodal cuing for proper technique and to facilitate improved neuromuscular control, strength, range of motion, and functional ability resulting in improved performance and form.   HOME EXERCISE PROGRAM Access Code: [email or web address] URL: [email or web address] Date: 05/06/2021 Prepared by: Rosita Kea   Exercises Prone Press Up - 4 x daily - 1 sets - 15 reps Sidelying Diaphragmatic Breathing - 1-2 x daily - 20 reps Standing Diaphragmatic Breathing - 1-2 x daily - 20 reps Hip Hinge Rock Back - 1-2 x daily - 1 sets - 20 reps - 5 seconds hold Bird Dog - 1 x daily - 3 sets - 10 reps Seated Hip External Rotation Stretch - 1 x daily - 3 sets - 30 seconds hold Sidelying Reverse Clamshell - 1 x daily - 3 sets - 10 reps   [email or web address] [email or web address]   hip airplane, -  Repeat 10 Times, Complete 3 Sets, Perform 1 Times a Day    PT Education -  05/19/21 1335     Education Details Exercise purpose/form. Self management techniques. handout on diaphragmatic breathing and updated HEP    Person(s) Educated Patient    Methods Explanation;Demonstration;Tactile cues;Verbal cues;Handout    Comprehension Verbalized understanding;Returned demonstration;Verbal cues required;Tactile cues required;Need further instruction              PT Short Term Goals - 05/19/21 1340       PT SHORT TERM GOAL #1   Title Be independent with initial home exercise program for self-management of symptoms.    Baseline initial HEP provided at IE (04/29/2021);    Time 2    Period Weeks    Status Achieved    Target Date 05/13/21               PT Long Term Goals - 04/29/21 1032       PT LONG TERM GOAL #1   Title Be independent with a long-term home  exercise program for self-management of symptoms.    Baseline Initial HEP provided at IE (04/29/2021);    Time 12    Period Weeks    Status New   TARGET DATE FOR ALL LONG TERM GOALS: 07/22/2021     PT LONG TERM GOAL #2   Title Demonstrate improved FOTO to equal or greater than 70 by visit #10 to demonstrate improvement in overall condition and self-reported functional ability.    Baseline 57 (04/29/2021);    Time 12    Period Weeks    Status New      PT LONG TERM GOAL #3   Title Reduce pain with functional activities to equal or less than 3/10 to allow patient to complete usual activities including exercising, walking, and sitting  with less difficulty.    Baseline 8/10 (04/29/21);    Time 12    Period Weeks    Status New      PT LONG TERM GOAL #4   Title Improve B hip strength to equal or greater than 4+/5 with no increase in pain to improve patient's ability to complete functional tasks such as exercising, sitting, and jumping with less difficulty.    Baseline some movements painful and as weak as 4/5 (04/29/2021);    Time 12    Period Weeks    Status New      PT LONG TERM GOAL #5    Title Complete community, work and/or recreational activities without limitation due to current condition    Baseline Functional Limitations: sitting in kayak for more than 10 min, russian twists, mountain bike for more than 20 min, sleeping, prolonged sitting, prolonged standing/walking,  working in seated position, getting up after sitting, cardio, jumping,  (04/29/2021);    Time 12    Period Weeks    Status New                   Plan - 05/19/21 1339     Clinical Impression Statement Patient tolerated treatment well with no increase in pain during exercises. Session focused on improving breathing coordination and hip muscle performance to improve activity tolerance and stability at the pelvis and core. Patient presents with DOMS from workout yesterday that she felt during today's session. Patient continues to have coccyx pain that limits her functional activities, mainly those that involve sitting.  Patient would benefit from continued management of limiting condition by skilled physical therapist to address remaining impairments and functional limitations to work towards stated goals and return to PLOF or maximal functional independence.    Personal Factors and Comorbidities Comorbidity 3+;Past/Current Experience;Time since onset of injury/illness/exacerbation    Comorbidities former smoker, prior ankle fracture with surgery R (2014), right neck pain.    Examination-Activity Limitations Bed Mobility;Squat;Locomotion Level;Stand;Transfers;Sit;Sleep    Examination-Participation Restrictions Driving;Occupation;Community Activity;Interpersonal Relationship;Other   sitting in kayak, russian twists, mountain bike, sleeping, prolonged sitting, prolonged standing/walking,  working in seated position, getting up after sitting, cardio, jumping, etc. Patient reports decreased quality of life and 25# weight gain   Stability/Clinical Decision Making Stable/Uncomplicated    Rehab Potential Good    PT  Frequency 2x / week    PT Duration 12 weeks    PT Treatment/Interventions ADLs/Self Care Home Management;Aquatic Therapy;Cryotherapy;Moist Heat;Electrical Stimulation;Manual techniques;Dry needling;Passive range of motion;Neuromuscular re-education;Therapeutic exercise;Therapeutic activities;Patient/family education;Spinal Manipulations;Joint Manipulations    PT Next Visit Plan update HEP as appropriate, core, hip, and funcitonal strengthening as tolerated. consider dry needling and manual    PT [place]  Access Code: [email or web address]               [email or web address] [email or web address]    Consulted and Agree with Plan of Care Patient             Patient will benefit from skilled therapeutic intervention in order to improve the following deficits and impairments:  Pain, Impaired sensation, Decreased mobility, Increased muscle spasms, Decreased activity tolerance, Decreased endurance, Decreased range of motion, Decreased strength, Hypomobility, Impaired perceived functional ability, Difficulty walking  Visit Diagnosis: Sacrococcygeal disorders, not elsewhere classified  Chronic bilateral low back pain without sciatica     Problem List Patient Active Problem List   Diagnosis Date Noted   Spondylosis of lumbosacral region without myelopathy or radiculopathy 04/15/2021   Nontraumatic coccydynia 04/01/2021    Everlean Alstrom. Graylon Good, PT, DPT 05/19/21, 1:40 PM   [facility] [address], [place], [address] Phone: [phone]   Fax:  [phone]  Name: Phyllis Smith MRN: [id number] Date of Birth: 1970-09-30

## 2021-05-20 ENCOUNTER — Other Ambulatory Visit: Payer: Self-pay

## 2021-05-20 ENCOUNTER — Ambulatory Visit (INDEPENDENT_AMBULATORY_CARE_PROVIDER_SITE_OTHER): Payer: BC Managed Care – PPO | Admitting: Family Medicine

## 2021-05-20 ENCOUNTER — Encounter: Payer: Self-pay | Admitting: Family Medicine

## 2021-05-20 VITALS — BP 108/72 | HR 64 | Temp 98.4°F | Ht 60.5 in | Wt 145.0 lb

## 2021-05-20 DIAGNOSIS — Z1159 Encounter for screening for other viral diseases: Secondary | ICD-10-CM | POA: Diagnosis not present

## 2021-05-20 DIAGNOSIS — Z114 Encounter for screening for human immunodeficiency virus [HIV]: Secondary | ICD-10-CM

## 2021-05-20 DIAGNOSIS — E559 Vitamin D deficiency, unspecified: Secondary | ICD-10-CM

## 2021-05-20 DIAGNOSIS — M533 Sacrococcygeal disorders, not elsewhere classified: Secondary | ICD-10-CM

## 2021-05-20 DIAGNOSIS — G8929 Other chronic pain: Secondary | ICD-10-CM | POA: Insufficient documentation

## 2021-05-20 DIAGNOSIS — Z1211 Encounter for screening for malignant neoplasm of colon: Secondary | ICD-10-CM

## 2021-05-20 DIAGNOSIS — Z0000 Encounter for general adult medical examination without abnormal findings: Secondary | ICD-10-CM | POA: Diagnosis not present

## 2021-05-20 DIAGNOSIS — M25571 Pain in right ankle and joints of right foot: Secondary | ICD-10-CM

## 2021-05-20 DIAGNOSIS — Z13220 Encounter for screening for lipoid disorders: Secondary | ICD-10-CM

## 2021-05-20 DIAGNOSIS — Z872 Personal history of diseases of the skin and subcutaneous tissue: Secondary | ICD-10-CM

## 2021-05-20 DIAGNOSIS — M47817 Spondylosis without myelopathy or radiculopathy, lumbosacral region: Secondary | ICD-10-CM

## 2021-05-20 MED ORDER — MELOXICAM 7.5 MG PO TABS: 7.5000 mg | ORAL_TABLET | Freq: Every day | ORAL | 1 refills | Status: DC | PRN

## 2021-05-20 MED ORDER — CYCLOBENZAPRINE HCL 5 MG PO TABS: 5.0000 mg | ORAL_TABLET | Freq: Every evening | ORAL | 1 refills | Status: DC | PRN

## 2021-05-20 NOTE — Assessment & Plan Note (Signed)
Return of right ankle pain in the setting of prior ORIF, will obtain dedicated x-rays and follow results.

## 2021-05-20 NOTE — Assessment & Plan Note (Signed)
Chronic symptomatology that has minimally improved following physical therapy, does note mild improvement/response from NSAIDs and body positioning, as ordered sacral support cushion as mentioned at last visit.  Given her radiographic findings and persistent symptomatology, she may benefit from focal image guided treatments, for this among other management options I have placed a referral to Dr. Holley Raring with pain and spine.

## 2021-05-20 NOTE — Assessment & Plan Note (Signed)
Has been compliant with physical therapy and medications, noting improvement, unfortunate improvement of the lumbosacral spine has not correlated with improvement at her coccyx.  I have encouraged her to continue formal PT, home exercises, consideration of dry needling, and medications as needed.

## 2021-05-20 NOTE — Patient Instructions (Addendum)
-   Obtain fasting labs with orders provided (can have water or black coffee but otherwise no food or drink x 8 hours before labs) - Review information provided - Attend eye doctor annually, dentist every 6 months, work towards or maintain 30 minutes of moderate intensity physical activity at least 5 days per week, and consume a balanced diet - Continue medications on an as-needed basis for tailbone pain - Continue with PT/home exercises - Referral coordinator will contact you for follow-up with dermatology, pain and spine, GI for colonoscopy - Return in 3 months - Contact us for any questions between now and then

## 2021-05-20 NOTE — Assessment & Plan Note (Signed)
Annual examination completed, risk stratification labs ordered, anticipatory guidance provided.  We will follow labs once resulted. Referral to GI for screening colonoscopy placed. Referral to Dermatology for skin health check and history of recurrent sunburns placed.

## 2021-05-20 NOTE — Progress Notes (Signed)
Annual Physical Exam Visit  Patient Information:  Patient ID: Phyllis Smith, female DOB: [birth year] Age: 50 y.o. MRN: [id number]   Subjective:   CC: Annual Physical Exam  HPI:  Phyllis Smith is here for their annual physical.  I reviewed the past medical history, family history, social history, surgical history, and allergies today and changes were made as necessary.  Please see the problem list section below for additional details.  Past Medical History: Past Medical History:  Diagnosis Date   Broken ankle, right, closed, initial encounter 2014   Past Surgical History: Past Surgical History:  Procedure Laterality Date   ANKLE FRACTURE SURGERY Right 2014   [name]  2001   CESAREAN SECTION  2007   ENDOMETRIAL BIOPSY  05/15/2021   Family History: Family History  Problem Relation Age of Onset   Mesothelioma Mother    Cancer Father    Gallbladder disease Father    Heart disease Maternal Grandmother    Allergies: No Known Allergies Health Maintenance: Health Maintenance  Topic Date Due   COVID-19 Vaccine (1) Never done   Pneumococcal Vaccine 64-74 Years old (1 - PCV) Never done   Hepatitis C Screening  Never done   PAP SMEAR-Modifier  Never done   COLONOSCOPY (Pts 45-41yrs Insurance coverage will need to be confirmed)  Never done   INFLUENZA VACCINE  10/17/2021 (Originally 02/17/2021)   TETANUS/TDAP  05/20/2022 (Originally 03/18/1992)   HIV Screening  Completed   HPV VACCINES  Aged Out    HM Colonoscopy          Overdue - COLONOSCOPY (Pts 45-32yrs Insurance coverage will need to be confirmed) (Every 10 Years) Overdue - never done    No completion history exists for this topic.           Medications: Current Outpatient Medications on File Prior to Visit  Medication Sig Dispense Refill   acetaminophen (TYLENOL) 500 MG tablet Take 1,000 mg by mouth every 6 (six) hours as needed.     Probiotic Product (ALIGN PO) Take 2 each by mouth  daily.     No current facility-administered medications on file prior to visit.    Review of Systems: No headache, visual changes, nausea, vomiting, diarrhea, constipation, dizziness, abdominal pain, skin rash, fevers, chills, night sweats, swollen lymph nodes, weight loss, chest pain, body aches, + joint pain, joint swelling, muscle aches, shortness of breath, mood changes, visual or auditory hallucinations reported.  Objective:   Vitals:   05/20/21 0946  BP: 108/72  Pulse: 64  Temp: 98.4 F (36.9 C)  SpO2: 98%   Vitals:   05/20/21 0946  Weight: 145 lb (65.8 kg)  Height: 5' 0.5" (1.537 m)   Body mass index is 27.85 kg/m.  General: Well Developed, well nourished, and in no acute distress.  Neuro: Alert and oriented x3, extra-ocular muscles intact, sensation grossly intact. Cranial nerves II through XII are intact, motor, sensory, and coordinative functions are all intact. HEENT: Normocephalic, atraumatic, pupils equal round reactive to light, neck supple, no masses, no lymphadenopathy, thyroid nonpalpable. Oropharynx, nasopharynx, external ear canals are unremarkable. Skin: Warm and dry, no rashes noted.  Cardiac: Regular rate and rhythm, no murmurs rubs or gallops. No peripheral edema. Pulses symmetric. Respiratory: Clear to auscultation bilaterally. Not using accessory muscles, speaking in full sentences.  Abdominal: Soft, nontender, nondistended, positive bowel sounds, no masses, no organomegaly. Musculoskeletal: Shoulder, elbow, wrist, hip, knee, ankle stable, and with full range of motion.  Tenderness at the lateral right ankle, no crepitations, well-healed surgical scar longitudinally noted  Female chaperone initials: BN present throughout the physical examination.  Impression and Recommendations:   The patient was counselled, risk factors were discussed, and anticipatory guidance given.  Annual physical exam Annual examination completed, risk stratification labs  ordered, anticipatory guidance provided.  We will follow labs once resulted. Referral to GI for screening colonoscopy placed. Referral to Dermatology for skin health check and history of recurrent sunburns placed.  Nontraumatic coccydynia Chronic symptomatology that has minimally improved following physical therapy, does note mild improvement/response from NSAIDs and body positioning, as ordered sacral support cushion as mentioned at last visit.  Given her radiographic findings and persistent symptomatology, she may benefit from focal image guided treatments, for this among other management options I have placed a referral to Dr. Holley Raring with pain and spine.  Spondylosis of lumbosacral region without myelopathy or radiculopathy Has been compliant with physical therapy and medications, noting improvement, unfortunate improvement of the lumbosacral spine has not correlated with improvement at her coccyx.  I have encouraged her to continue formal PT, home exercises, consideration of dry needling, and medications as needed.  Chronic pain of right ankle Return of right ankle pain in the setting of prior ORIF, will obtain dedicated x-rays and follow results.  Orders & Medications Medications:  Meds ordered this encounter  Medications   cyclobenzaprine (FLEXERIL) 5 MG tablet    Sig: Take 1-2 tablets (5-10 mg total) by mouth at bedtime as needed for muscle spasms.    Dispense:  60 tablet    Refill:  1   meloxicam (MOBIC) 7.5 MG tablet    Sig: Take 1 tablet (7.5 mg total) by mouth daily as needed for pain.    Dispense:  30 tablet    Refill:  1   Orders Placed This Encounter  Procedures   DG Ankle Complete Right   DG Foot Complete Right   TSH Rfx on Abnormal to Free T4   Lipid panel   Comprehensive metabolic panel   CBC with Differential/Platelet   VITAMIN D 25 Hydroxy (Vit-D Deficiency, Fractures)   Apo A1 + B + Ratio   Hepatitis C Antibody   HIV antibody (with reflex)   Ambulatory referral  to Gastroenterology   Ambulatory referral to Anesthesiology   Ambulatory referral to Dermatology     Return in about 3 months (around 08/20/2021).    [name], MD   [facility] [place]

## 2021-05-21 ENCOUNTER — Ambulatory Visit: Payer: BC Managed Care – PPO | Attending: Family Medicine | Admitting: Physical Therapy

## 2021-05-21 ENCOUNTER — Encounter: Payer: Self-pay | Admitting: Physical Therapy

## 2021-05-21 DIAGNOSIS — G8929 Other chronic pain: Secondary | ICD-10-CM | POA: Insufficient documentation

## 2021-05-21 DIAGNOSIS — M533 Sacrococcygeal disorders, not elsewhere classified: Secondary | ICD-10-CM | POA: Insufficient documentation

## 2021-05-21 DIAGNOSIS — M545 Low back pain, unspecified: Secondary | ICD-10-CM | POA: Diagnosis not present

## 2021-05-21 NOTE — Therapy (Signed)
[facility] [address] [place], [address] Phone: [phone]   Fax:  [phone]  Physical Therapy Treatment  Patient Details  Name: Phyllis Smith MRN: [id number] Date of Birth: [birth year] Referring Provider (PT): [name], MD   Encounter Date: 05/21/2021   PT End of Session - 05/21/21 1039     Visit Number 4    Number of Visits 24    Date for PT Re-Evaluation 07/22/21    Authorization Type [employer] reporting from 04/29/2021    Authorization Time Period 30 PT/OT/ST per cal yr    Authorization - Visit Number 4    Authorization - Number of Visits 30    Progress Note Due on Visit 10    PT Start Time 0902    PT Stop Time 0942    PT Time Calculation (min) 40 min    Activity Tolerance Patient tolerated treatment well    Behavior During Therapy [facility] for tasks assessed/performed             Past Medical History:  Diagnosis Date   Broken ankle, right, closed, initial encounter 2014    Past Surgical History:  Procedure Laterality Date   ANKLE FRACTURE SURGERY Right 2014   [place]  2007   ENDOMETRIAL BIOPSY  05/15/2021    There were no vitals filed for this visit.   Subjective Assessment - 05/21/21 0905     Subjective Patient reports she saw her PCP yesterday and was discussing her pain with her. Her PCP reccomended she try dry needling and going to 1x a week. Pateint reports she is very uncomfortable with the mask in the clinic. Reports her coccyx pain has been the same but her back is improving. She reports no pain upon arrival unless she sits directly on her tail bone. States she ordered a coccyx pillow. Is able to do the diaphragmatic breathing but it is awkward. Driving here was not super painful. She is being referred to a clinic that can do injections. She is also taking less anti-inflammatory medications. She has been  able to increase her sitting time tolerance.    Pertinent History Patient is a 50 y.o. female who presents to outpatient physical therapy with a referral for medical diagnosis nontraumatic coccydynia, spondylosis of lumbosacral region without myelopathy or radiculopathy. This patient's chief complaints consist of pain at the coccyx and low back pain R > L that radiates into her glutes leading to the following functional deficits: difficulty with sitting in kayak for more than 10 min, russian twists, mountain bike for more than 20 min, sleeping, prolonged sitting, prolonged standing/walking,  working in seated position, getting up after sitting, cardio, jumping, etc. Patient reports decreased quality of life and 25# weight gain that she attributes to her impairments and functional deficits.   Relevant past medical history and comorbidities include former smoker, prior ankle fracture with surgery R (2014).  Patient denies hx of cancer, stroke, seizures, lung problem, major cardiac events, diabetes, unexplained weight loss, new onset stumbling or dropping things, osteoporosis, spinal surgery.    Limitations Sitting;Standing;Walking;House hold activities;Other (comment)   sitting in kayak, russian twists, mountain bike, sleeping, prolonged sitting, prolonged standing/walking,  working in seated position, getting up after sitting, cardio, jumping, etc. Patient reports decreased quality of life and 25# weight gain   Diagnostic tests Sacrum/Coccyx xray report 04/03/2021: "IMPRESSION:  1. Mildly increased  posterior subluxation of the distal coccyx when  sitting, measuring 18 degrees, which does not meet criteria for  hypermobility (>25 degrees).  2. No acute osseous abnormality."  Lumbar xray report 04/03/2021: "IMPRESSION:  1. Lower lumbar facet arthropathy, severe at L4-L5."    Patient Stated Goals Want better mobility, to learn what to do when it does hurt    Currently in Pain? No/denies    Pain Onset More than a  month ago                TREATMENT:    Therapeutic exercise: to centralize symptoms and improve ROM, strength, muscular endurance, and activity tolerance required for successful completion of functional activities.  - seated pelvic floor contraction progressing to relaxation with education on how to perform in coordination with diaphragmatic breathing to emphasize pelvic floor relaxation. Instructed on how to feel for proper pelvic floor contraction.  ( Manual therapy / Dry needling - see below).  - modified hip airplanes with hands on hips and toe on floor, 2x10 each side.  - lateral stepping with GTB around ankles, 1x30 feet each direction  - Education on HEP including handout    Pt required multimodal cuing for proper technique and to facilitate improved neuromuscular control, strength, range of motion, and functional ability resulting in improved performance and form.  Manual therapy: to reduce pain and tissue tension, improve range of motion, neuromodulation, in order to promote improved ability to complete functional activities. PRONE/SIDELYING - STM to lumbar paraspinals R > L - STM to bilateral deep hip external rotators and glute muscles  Modality: (unbilled) Dry needling performed to bilateral hip deep external rotators to decrease pain and spasms along patient's coccyx and glute region with patient in sidelying and prone utilizing (2) dry needle(s) .44mm x 123mm with (3) sticks at right deep rotators and (1) stick at left deep rotators. Patient educated about the risks and benefits from therapy and verbally consents to treatment.  Dry needling performed by Everlean Alstrom. Graylon Good PT, DPT who is certified in this technique.     HOME EXERCISE PROGRAM Access Code: [email or web address] URL: [email or web address] Date: 05/21/2021 Prepared by: Rosita Kea  Exercises Prone Press Up - 4 x daily - 1 sets - 15 reps Sidelying Diaphragmatic Breathing - 1-2 x daily - 20 reps Standing  Diaphragmatic Breathing - 1-2 x daily - 20 reps Hip Hinge Rock Back - 1-2 x daily - 1 sets - 20 reps - 5 seconds hold Bird Dog - 1 x daily - 3 sets - 10 reps Seated Hip External Rotation Stretch - 1 x daily - 3 sets - 30 seconds hold Sidelying Reverse Clamshell - 1 x daily - 3 sets - 10 reps Diaphragmatic Breathing in Supported Child's Pose with Pelvic Floor Relaxation - 1-2 x daily - 5 minutes practice time    [email or web address] [email or web address]    hip airplane, -  Repeat 10 Times, Complete 3 Sets, Perform 1 Times a Day    PT Education - 05/21/21 1039     Education Details Exercise purpose/form. Self management techniques. handout on diaphragmatic breathing with pelvic floor relaxation HEP    Person(s) Educated Patient    Methods Explanation;Demonstration;Tactile cues;Verbal cues;Handout    Comprehension Verbalized understanding;Returned demonstration;Verbal cues required;Tactile cues required;Need further instruction              PT Short Term Goals - 05/19/21 1340       PT SHORT TERM GOAL #1   Title Be independent with initial home  exercise program for self-management of symptoms.    Baseline initial HEP provided at IE (04/29/2021);    Time 2    Period Weeks    Status Achieved    Target Date 05/13/21               PT Long Term Goals - 04/29/21 1032       PT LONG TERM GOAL #1   Title Be independent with a long-term home exercise program for self-management of symptoms.    Baseline Initial HEP provided at IE (04/29/2021);    Time 12    Period Weeks    Status New   TARGET DATE FOR ALL LONG TERM GOALS: 07/22/2021     PT LONG TERM GOAL #2   Title Demonstrate improved FOTO to equal or greater than 70 by visit #10 to demonstrate improvement in overall condition and self-reported functional ability.    Baseline 57 (04/29/2021);    Time 12    Period Weeks    Status New      PT LONG TERM GOAL #3   Title Reduce pain with functional activities to equal or less than 3/10 to allow  patient to complete usual activities including exercising, walking, and sitting  with less difficulty.    Baseline 8/10 (04/29/21);    Time 12    Period Weeks    Status New      PT LONG TERM GOAL #4   Title Improve B hip strength to equal or greater than 4+/5 with no increase in pain to improve patient's ability to complete functional tasks such as exercising, sitting, and jumping with less difficulty.    Baseline some movements painful and as weak as 4/5 (04/29/2021);    Time 12    Period Weeks    Status New      PT LONG TERM GOAL #5   Title Complete community, work and/or recreational activities without limitation due to current condition    Baseline Functional Limitations: sitting in kayak for more than 10 min, russian twists, mountain bike for more than 20 min, sleeping, prolonged sitting, prolonged standing/walking,  working in seated position, getting up after sitting, cardio, jumping,  (04/29/2021);    Time 12    Period Weeks    Status New                   Plan - 05/21/21 1043     Clinical Impression Statement Pateint tolerated treatment well overall and continues to report improved back pain. Was TTP in right deep hip external rotator region and demonstrated worse proprioception and neuromuscular control when weight bearing on R LE compared to L LE. Patient does report improvements in sitting tolerance and medication use for coccyx pain and her back pain appears to be improving. PT agrees with reducing visit frequency to 1x  week per patient frequency. Patient would benefit from continued management of limiting condition by skilled physical therapist to address remaining impairments and functional limitations to work towards stated goals and return to PLOF or maximal functional independence.    Personal Factors and Comorbidities Comorbidity 3+;Past/Current Experience;Time since onset of injury/illness/exacerbation    Comorbidities former smoker, prior ankle fracture with  surgery R (2014), right neck pain.    Examination-Activity Limitations Bed Mobility;Squat;Locomotion Level;Stand;Transfers;Sit;Sleep    Examination-Participation Restrictions Driving;Occupation;Community Activity;Interpersonal Relationship;Other   sitting in kayak, russian twists, mountain bike, sleeping, prolonged sitting, prolonged standing/walking,  working in seated position, getting up after sitting, cardio, jumping, etc. Patient reports decreased  quality of life and 25# weight gain   Stability/Clinical Decision Making Stable/Uncomplicated    Rehab Potential Good    PT Frequency 2x / week    PT Duration 12 weeks    PT Treatment/Interventions ADLs/Self Care Home Management;Aquatic Therapy;Cryotherapy;Moist Heat;Electrical Stimulation;Manual techniques;Dry needling;Passive range of motion;Neuromuscular re-education;Therapeutic exercise;Therapeutic activities;Patient/family education;Spinal Manipulations;Joint Manipulations    PT Next Visit Plan update HEP as appropriate, core, hip, and funcitonal strengthening as tolerated. consider dry needling and manual    PT Home Exercise Plan Medbridge Access Code: [email or web address]               [email or web address]    Consulted and Agree with Plan of Care Patient             Patient will benefit from skilled therapeutic intervention in order to improve the following deficits and impairments:  Pain, Impaired sensation, Decreased mobility, Increased muscle spasms, Decreased activity tolerance, Decreased endurance, Decreased range of motion, Decreased strength, Hypomobility, Impaired perceived functional ability, Difficulty walking  Visit Diagnosis: Sacrococcygeal disorders, not elsewhere classified  Chronic bilateral low back pain without sciatica     Problem List Patient Active Problem List   Diagnosis Date Noted   Annual physical exam 05/20/2021   Chronic pain of right ankle 05/20/2021   Spondylosis of lumbosacral region without myelopathy or  radiculopathy 04/15/2021   Nontraumatic coccydynia 04/01/2021    Everlean Alstrom. Graylon Good, PT, DPT 05/21/21, 10:44 AM   [facility] [address] [place], [address] Phone: [phone]   Fax:  [phone]  Name: Laqueta Bonaventura MRN: [id number] Date of Birth: 1970/09/25

## 2021-05-26 ENCOUNTER — Ambulatory Visit: Payer: BC Managed Care – PPO | Admitting: Physical Therapy

## 2021-05-26 ENCOUNTER — Encounter: Payer: Self-pay | Admitting: Physical Therapy

## 2021-05-26 DIAGNOSIS — M533 Sacrococcygeal disorders, not elsewhere classified: Secondary | ICD-10-CM

## 2021-05-26 DIAGNOSIS — M545 Low back pain, unspecified: Secondary | ICD-10-CM

## 2021-05-26 DIAGNOSIS — G8929 Other chronic pain: Secondary | ICD-10-CM | POA: Diagnosis not present

## 2021-05-26 NOTE — Therapy (Signed)
[facility] [address] [place], [address] Phone: [phone]   Fax:  [phone]  Physical Therapy Treatment  Patient Details  Name: Phyllis Smith MRN: [id number] Date of Birth: 01-25-1971 Referring Provider (PT): [name], MD   Encounter Date: 05/26/2021   PT End of Session - 05/26/21 0909     Visit Number 5    Number of Visits 24    Date for PT Re-Evaluation 07/22/21    Authorization Type [employer] reporting from 04/29/2021    Authorization Time Period 30 PT/OT/ST per cal yr    Authorization - Visit Number 5    Authorization - Number of Visits 30    Progress Note Due on Visit 10    PT Start Time 0902    PT Stop Time 0942    PT Time Calculation (min) 40 min    Activity Tolerance Patient tolerated treatment well    Behavior During Therapy [facility] for tasks assessed/performed             Past Medical History:  Diagnosis Date   Broken ankle, right, closed, initial encounter 2014    Past Surgical History:  Procedure Laterality Date   ANKLE FRACTURE SURGERY Right 2014   [place]  2007   ENDOMETRIAL BIOPSY  05/15/2021    There were no vitals filed for this visit.   Subjective Assessment - 05/26/21 0903     Subjective Pateint reports she continues to feel better. Her back feels loose and she cannot feel pain at rest. She thinks her exercises are working and has been practicing her breathing and it helped her. She got her coccyx pillow and used it in the car on her way here. Reports current coxxyc pain 2/10 when she sits on it. She does feel the dry needling helped her.    Pertinent History Patient is a 50 y.o. female who presents to outpatient physical therapy with a referral for medical diagnosis nontraumatic coccydynia, spondylosis of lumbosacral region without myelopathy or radiculopathy. This patient's chief complaints  consist of pain at the coccyx and low back pain R > L that radiates into her glutes leading to the following functional deficits: difficulty with sitting in kayak for more than 10 min, russian twists, mountain bike for more than 20 min, sleeping, prolonged sitting, prolonged standing/walking,  working in seated position, getting up after sitting, cardio, jumping, etc. Patient reports decreased quality of life and 25# weight gain that she attributes to her impairments and functional deficits.   Relevant past medical history and comorbidities include former smoker, prior ankle fracture with surgery R (2014).  Patient denies hx of cancer, stroke, seizures, lung problem, major cardiac events, diabetes, unexplained weight loss, new onset stumbling or dropping things, osteoporosis, spinal surgery.    Limitations Sitting;Standing;Walking;House hold activities;Other (comment)   sitting in kayak, russian twists, mountain bike, sleeping, prolonged sitting, prolonged standing/walking,  working in seated position, getting up after sitting, cardio, jumping, etc. Patient reports decreased quality of life and 25# weight gain   Diagnostic tests Sacrum/Coccyx xray report 04/03/2021: "IMPRESSION:  1. Mildly increased posterior subluxation of the distal coccyx when  sitting, measuring 18 degrees, which does not meet criteria for  hypermobility (>25 degrees).  2. No acute osseous abnormality."  Lumbar xray report 04/03/2021: "IMPRESSION:  1. Lower lumbar facet arthropathy, severe at L4-L5."    Patient Stated Goals Want  better mobility, to learn what to do when it does hurt    Currently in Pain? Yes    Pain Score 2     Pain Onset More than a month ago             OBJECTIVE  SELF-REPORTED FUNCTION FOTO score: 69/100 (lumbar spine questionnaire, advised by PT to answer as both areas of pain she is seeking care for - coccyx and low back)     TREATMENT:    Therapeutic exercise: to centralize symptoms and improve ROM,  strength, muscular endurance, and activity tolerance required for successful completion of functional activities.  - Quadruped bird dog (alternating shoulder flexion/contralateral hip extension with core muscles braced) , with foam roll over sacrum for biofeedback to help keep core stabilized. 3x10 each side.  - modified hip airplanes with hands on hips and toe on floor but attempting to take it off as able, 3x10 each side. - standing wall clams with green theraband around knees, 3x10 each side - Sidelying reverse clam shell with green theraband around ankles, 1x10 each side with GTB - Education on HEP including handout    Pt required multimodal cuing for proper technique and to facilitate improved neuromuscular control, strength, range of motion, and functional ability resulting in improved performance and form.   HOME EXERCISE PROGRAM Access Code: [email or web address] URL: [email or web address] Date: 05/21/2021 Prepared by: Rosita Kea   Exercises Prone Press Up - 4 x daily - 1 sets - 15 reps Sidelying Diaphragmatic Breathing - 1-2 x daily - 20 reps Standing Diaphragmatic Breathing - 1-2 x daily - 20 reps Hip Hinge Rock Back - 1-2 x daily - 1 sets - 20 reps - 5 seconds hold Bird Dog - 1 x daily - 3 sets - 10 reps Seated Hip External Rotation Stretch - 1 x daily - 3 sets - 30 seconds hold Sidelying Reverse Clamshell - 1 x daily - 3 sets - 10 reps Diaphragmatic Breathing in Supported Child's Pose with Pelvic Floor Relaxation - 1-2 x daily - 5 minutes practice time     [email or web address]    hip airplane, -  Repeat 10 Times, Complete 3 Sets, Perform 1 Times a Day    PT Education - 05/26/21 0909     Education Details Exercise purpose/form. Self management techniques.    Person(s) Educated Patient    Methods Explanation;Demonstration;Tactile cues;Verbal cues    Comprehension Need further instruction;Verbalized understanding;Returned demonstration;Verbal cues required;Tactile cues  required              PT Short Term Goals - 05/19/21 1340       PT SHORT TERM GOAL #1   Title Be independent with initial home exercise program for self-management of symptoms.    Baseline initial HEP provided at IE (04/29/2021);    Time 2    Period Weeks    Status Achieved    Target Date 05/13/21               PT Long Term Goals - 04/29/21 1032       PT LONG TERM GOAL #1   Title Be independent with a long-term home exercise program for self-management of symptoms.    Baseline Initial HEP provided at IE (04/29/2021);    Time 12    Period Weeks    Status New   TARGET DATE FOR ALL LONG TERM GOALS: 07/22/2021     PT LONG TERM GOAL #2   Title Demonstrate improved FOTO to equal or greater  than 70 by visit #10 to demonstrate improvement in overall condition and self-reported functional ability.    Baseline 57 (04/29/2021);    Time 12    Period Weeks    Status New      PT LONG TERM GOAL #3   Title Reduce pain with functional activities to equal or less than 3/10 to allow patient to complete usual activities including exercising, walking, and sitting  with less difficulty.    Baseline 8/10 (04/29/21);    Time 12    Period Weeks    Status New      PT LONG TERM GOAL #4   Title Improve B hip strength to equal or greater than 4+/5 with no increase in pain to improve patient's ability to complete functional tasks such as exercising, sitting, and jumping with less difficulty.    Baseline some movements painful and as weak as 4/5 (04/29/2021);    Time 12    Period Weeks    Status New      PT LONG TERM GOAL #5   Title Complete community, work and/or recreational activities without limitation due to current condition    Baseline Functional Limitations: sitting in kayak for more than 10 min, russian twists, mountain bike for more than 20 min, sleeping, prolonged sitting, prolonged standing/walking,  working in seated position, getting up after sitting, cardio, jumping,   (04/29/2021);    Time 12    Period Weeks    Status New                   Plan - 05/26/21 0942     Clinical Impression Statement Patient tolerated treatment well overall and was able to continue advancing exercises slightly. Overall appears to be improving. Patient continues to report feeling uncomfortable with exertion in the clinic while wearing a mask. She noticed some achiness in the right ankle that she relates to her prior ankle fracture/surgery. Patient would benefit from continued management of limiting condition by skilled physical therapist to address remaining impairments and functional limitations to work towards stated goals and return to PLOF or maximal functional independence.    Personal Factors and Comorbidities Comorbidity 3+;Past/Current Experience;Time since onset of injury/illness/exacerbation    Comorbidities former smoker, prior ankle fracture with surgery R (2014), right neck pain.    Examination-Activity Limitations Bed Mobility;Squat;Locomotion Level;Stand;Transfers;Sit;Sleep    Examination-Participation Restrictions Driving;Occupation;Community Activity;Interpersonal Relationship;Other   sitting in kayak, russian twists, mountain bike, sleeping, prolonged sitting, prolonged standing/walking,  working in seated position, getting up after sitting, cardio, jumping, etc. Patient reports decreased quality of life and 25# weight gain   Stability/Clinical Decision Making Stable/Uncomplicated    Rehab Potential Good    PT Frequency 2x / week    PT Duration 12 weeks    PT Treatment/Interventions ADLs/Self Care Home Management;Aquatic Therapy;Cryotherapy;Moist Heat;Electrical Stimulation;Manual techniques;Dry needling;Passive range of motion;Neuromuscular re-education;Therapeutic exercise;Therapeutic activities;Patient/family education;Spinal Manipulations;Joint Manipulations    PT Next Visit Plan update HEP as appropriate, core, hip, and funcitonal strengthening as  tolerated. consider dry needling and manual    PT Home Exercise Plan Medbridge Access Code: [email or web address]               [email or web address]    Consulted and Agree with Plan of Care Patient             Patient will benefit from skilled therapeutic intervention in order to improve the following deficits and impairments:  Pain, Impaired sensation, Decreased mobility, Increased muscle spasms, Decreased activity tolerance, Decreased endurance, Decreased  range of motion, Decreased strength, Hypomobility, Impaired perceived functional ability, Difficulty walking  Visit Diagnosis: Sacrococcygeal disorders, not elsewhere classified  Chronic bilateral low back pain without sciatica     Problem List Patient Active Problem List   Diagnosis Date Noted   Annual physical exam 05/20/2021   Chronic pain of right ankle 05/20/2021   Spondylosis of lumbosacral region without myelopathy or radiculopathy 04/15/2021   Nontraumatic coccydynia 04/01/2021   Everlean Alstrom. Graylon Good, PT, DPT 05/26/21, 9:44 AM  [facility] [address] [place], [address] Phone: [phone]   Fax:  [phone]  Name: Phyllis Smith MRN: [id number] Date of Birth: May 02, 1971

## 2021-05-28 ENCOUNTER — Encounter: Payer: BC Managed Care – PPO | Admitting: Physical Therapy

## 2021-06-02 ENCOUNTER — Ambulatory Visit: Payer: BC Managed Care – PPO | Admitting: Physical Therapy

## 2021-06-04 ENCOUNTER — Encounter: Payer: BC Managed Care – PPO | Admitting: Physical Therapy

## 2021-06-06 ENCOUNTER — Telehealth: Payer: Self-pay

## 2021-06-06 ENCOUNTER — Encounter: Payer: Self-pay | Admitting: Family Medicine

## 2021-06-06 ENCOUNTER — Other Ambulatory Visit: Payer: Self-pay

## 2021-06-06 DIAGNOSIS — Z1211 Encounter for screening for malignant neoplasm of colon: Secondary | ICD-10-CM

## 2021-06-06 MED ORDER — NA SULFATE-K SULFATE-MG SULF 17.5-3.13-1.6 GM/177ML PO SOLN: 1.0000 | Freq: Once | ORAL | 0 refills | Status: AC

## 2021-06-06 NOTE — Progress Notes (Signed)
Gastroenterology Pre-Procedure Review  Request Date: 06/23/21 Requesting Physician: Dr. Vicente Males  PATIENT REVIEW QUESTIONS: The patient responded to the following health history questions as indicated:    1. Are you having any GI issues? no 2. Do you have a personal history of Polyps? no 3. Do you have a family history of Colon Cancer or Polyps? no 4. Diabetes Mellitus? no 5. Joint replacements in the past 12 months?no 6. Major health problems in the past 3 months?no 7. Any artificial heart valves, MVP, or defibrillator?no    MEDICATIONS & ALLERGIES:    Patient reports the following regarding taking any anticoagulation/antiplatelet therapy:   Plavix, Coumadin, Eliquis, Xarelto, Lovenox, Pradaxa, Brilinta, or Effient? no Aspirin? No  Patient confirms/reports the following medications:  Current Outpatient Medications  Medication Sig Dispense Refill   Na Sulfate-K Sulfate-Mg Sulf 17.5-3.13-1.6 GM/177ML SOLN Take 1 kit by mouth once for 1 dose. 354 mL 0   acetaminophen (TYLENOL) 500 MG tablet Take 1,000 mg by mouth every 6 (six) hours as needed.     cyclobenzaprine (FLEXERIL) 5 MG tablet Take 1-2 tablets (5-10 mg total) by mouth at bedtime as needed for muscle spasms. 60 tablet 1   meloxicam (MOBIC) 7.5 MG tablet Take 1 tablet (7.5 mg total) by mouth daily as needed for pain. 30 tablet 1   Probiotic Product (ALIGN PO) Take 2 each by mouth daily.     No current facility-administered medications for this visit.    Patient confirms/reports the following allergies:  No Known Allergies  No orders of the defined types were placed in this encounter.   AUTHORIZATION INFORMATION Primary Insurance: 1D#: Group #:  Secondary Insurance: 1D#: Group #:  SCHEDULE INFORMATION: Date: 06/23/21 Time: Location: [facility]

## 2021-06-06 NOTE — Telephone Encounter (Signed)
Procedure scheduled for 06/23/21.

## 2021-06-06 NOTE — Telephone Encounter (Signed)
Inbound call from pt stating that she is ready to schedule her colonoscopy

## 2021-06-09 ENCOUNTER — Encounter: Payer: Self-pay | Admitting: Physical Therapy

## 2021-06-09 DIAGNOSIS — M533 Sacrococcygeal disorders, not elsewhere classified: Secondary | ICD-10-CM

## 2021-06-09 DIAGNOSIS — G8929 Other chronic pain: Secondary | ICD-10-CM

## 2021-06-09 NOTE — Therapy (Signed)
[facility] [address], [place], [address] Phone: [phone]   Fax:  [phone]  Physical Therapy No-Visit Discharge Summary Date of reporting from 04/29/2021 to 06/09/2021  Patient Details  Name: Phyllis Smith MRN: [id number] Date of Birth: 1971-03-11 Referring Provider (PT): [name], MD   Encounter Date: 06/09/2021    Past Medical History:  Diagnosis Date   Broken ankle, right, closed, initial encounter 2014    Past Surgical History:  Procedure Laterality Date   ANKLE FRACTURE SURGERY Right 2014   APPENDECTOMY  1994   CESAREAN SECTION  2001   CESAREAN SECTION  2007   ENDOMETRIAL BIOPSY  05/15/2021    There were no vitals filed for this visit.   Subjective Assessment - 06/09/21 0911     Subjective Patient left VM requesting to cancel all future PT sessions due to being too busy to participate right now.    Pertinent History Patient is a 50 y.o. female who presents to outpatient physical therapy with a referral for medical diagnosis nontraumatic coccydynia, spondylosis of lumbosacral region without myelopathy or radiculopathy. This patient's chief complaints consist of pain at the coccyx and low back pain R > L that radiates into her glutes leading to the following functional deficits: difficulty with sitting in kayak for more than 10 min, russian twists, mountain bike for more than 20 min, sleeping, prolonged sitting, prolonged standing/walking,  working in seated position, getting up after sitting, cardio, jumping, etc. Patient reports decreased quality of life and 25# weight gain that she attributes to her impairments and functional deficits.   Relevant past medical history and comorbidities include former smoker, prior ankle fracture with surgery R (2014).  Patient denies hx of cancer, stroke, seizures, lung problem, major cardiac events, diabetes, unexplained weight loss, new onset stumbling  or dropping things, osteoporosis, spinal surgery.    Limitations Sitting;Standing;Walking;House hold activities;Other (comment)   sitting in kayak, russian twists, mountain bike, sleeping, prolonged sitting, prolonged standing/walking,  working in seated position, getting up after sitting, cardio, jumping, etc. Patient reports decreased quality of life and 25# weight gain   Diagnostic tests Sacrum/Coccyx xray report 04/03/2021: "IMPRESSION:  1. Mildly increased posterior subluxation of the distal coccyx when  sitting, measuring 18 degrees, which does not meet criteria for  hypermobility (>25 degrees).  2. No acute osseous abnormality."  Lumbar xray report 04/03/2021: "IMPRESSION:  1. Lower lumbar facet arthropathy, severe at L4-L5."    Patient Stated Goals Want better mobility, to learn what to do when it does hurt             OBJECTIVE Patient is not present for examination at this time. Please see previous documentation for latest objective data.      PT Short Term Goals - 05/19/21 1340       PT SHORT TERM GOAL #1   Title Be independent with initial home exercise program for self-management of symptoms.    Baseline initial HEP provided at IE (04/29/2021);    Time 2    Period Weeks    Status Achieved    Target Date 05/13/21               PT Long Term Goals - 06/09/21 0912       PT LONG TERM GOAL #1   Title Be independent with a long-term home exercise program for self-management of symptoms.    Baseline Initial HEP provided at IE (04/29/2021);    Time  12    Period Weeks    Status Achieved   TARGET DATE FOR ALL LONG TERM GOALS: 07/22/2021     PT LONG TERM GOAL #2   Title Demonstrate improved FOTO to equal or greater than 70 by visit #10 to demonstrate improvement in overall condition and self-reported functional ability.    Baseline 57 (04/29/2021); 69 at visit #5 (05/26/2021);    Time 12    Period Weeks    Status Partially Met      PT LONG TERM GOAL #3   Title Reduce  pain with functional activities to equal or less than 3/10 to allow patient to complete usual activities including exercising, walking, and sitting  with less difficulty.    Baseline 8/10 (04/29/21); improved but not labeled (06/09/2021);    Time 12    Period Weeks    Status Partially Met      PT LONG TERM GOAL #4   Title Improve B hip strength to equal or greater than 4+/5 with no increase in pain to improve patient's ability to complete functional tasks such as exercising, sitting, and jumping with less difficulty.    Baseline some movements painful and as weak as 4/5 (04/29/2021);    Time 12    Period Weeks    Status Unable to assess      PT LONG TERM GOAL #5   Title Complete community, work and/or recreational activities without limitation due to current condition    Baseline Functional Limitations: sitting in kayak for more than 10 min, russian twists, mountain bike for more than 20 min, sleeping, prolonged sitting, prolonged standing/walking,  working in seated position, getting up after sitting, cardio, jumping,  (04/29/2021); reports improved but still limited on 117/2022.    Time 12    Period Weeks    Status Partially Met                Plan - 06/09/21 0916     Clinical Impression Statement ?Patient attended 5 physical therapy sessions and demonstrated significant improvement on FOTO score (from 57 to 69) reflecting improved self-reported function. She appeared to feel better with improved exercise participation. She is now being discharged due to her request to cancel future appointments due to being too busy. Patient also expressed discomfort with coming to clinic where she had to wear a face covering during exercise.    Personal Factors and Comorbidities Comorbidity 3+;Past/Current Experience;Time since onset of injury/illness/exacerbation    Comorbidities former smoker, prior ankle fracture with surgery R (2014), right neck pain.    Examination-Activity Limitations Bed  Mobility;Squat;Locomotion Level;Stand;Transfers;Sit;Sleep    Examination-Participation Restrictions Driving;Occupation;Community Activity;Interpersonal Relationship;Other   sitting in kayak, russian twists, mountain bike, sleeping, prolonged sitting, prolonged standing/walking,  working in seated position, getting up after sitting, cardio, jumping, etc. Patient reports decreased quality of life and 25# weight gain   Stability/Clinical Decision Making Stable/Uncomplicated    Rehab Potential Good    PT Frequency 2x / week    PT Duration 12 weeks    PT Treatment/Interventions ADLs/Self Care Home Management;Aquatic Therapy;Cryotherapy;Moist Heat;Electrical Stimulation;Manual techniques;Dry needling;Passive range of motion;Neuromuscular re-education;Therapeutic exercise;Therapeutic activities;Patient/family education;Spinal Manipulations;Joint Manipulations    PT Next Visit Plan patinet is now discharged from [place] Access Code: [email or web address]               [email or web address]    Consulted and Agree with Plan of Care Patient  Patient will benefit from skilled therapeutic intervention in order to improve the following deficits and impairments:  Pain, Impaired sensation, Decreased mobility, Increased muscle spasms, Decreased activity tolerance, Decreased endurance, Decreased range of motion, Decreased strength, Hypomobility, Impaired perceived functional ability, Difficulty walking  Visit Diagnosis: Sacrococcygeal disorders, not elsewhere classified  Chronic bilateral low back pain without sciatica     Problem List Patient Active Problem List   Diagnosis Date Noted   Annual physical exam 05/20/2021   Chronic pain of right ankle 05/20/2021   Spondylosis of lumbosacral region without myelopathy or radiculopathy 04/15/2021   Nontraumatic coccydynia 04/01/2021   Everlean Alstrom. Graylon Good, PT, DPT 06/09/21, 9:16 AM   [facility] [address] [place], [address] Phone: [phone]   Fax:  [phone]  Name: Tiffany Calmes MRN: [id number] Date of Birth: [birth year]

## 2021-06-17 ENCOUNTER — Encounter: Payer: BC Managed Care – PPO | Admitting: Physical Therapy

## 2021-06-18 DIAGNOSIS — L578 Other skin changes due to chronic exposure to nonionizing radiation: Secondary | ICD-10-CM | POA: Diagnosis not present

## 2021-06-18 DIAGNOSIS — L573 Poikiloderma of Civatte: Secondary | ICD-10-CM | POA: Diagnosis not present

## 2021-06-23 ENCOUNTER — Ambulatory Visit: Admit: 2021-06-23 | Payer: BC Managed Care – PPO | Admitting: Gastroenterology

## 2021-06-23 SURGERY — COLONOSCOPY WITH PROPOFOL
Anesthesia: General

## 2021-06-24 ENCOUNTER — Encounter: Payer: BC Managed Care – PPO | Admitting: Physical Therapy

## 2021-06-26 ENCOUNTER — Other Ambulatory Visit: Payer: Self-pay | Admitting: Family Medicine

## 2021-06-26 DIAGNOSIS — M533 Sacrococcygeal disorders, not elsewhere classified: Secondary | ICD-10-CM

## 2021-06-26 NOTE — Telephone Encounter (Signed)
Requested medication (s) are due for refill today:   Requested medication (s) are on the active medication list: No  Last refill:  04/15/21  Future visit scheduled: Yes  Notes to clinic:  Medication is not on list.    Requested Prescriptions  Pending Prescriptions Disp Refills   diclofenac (VOLTAREN) 75 MG EC tablet [Pharmacy Med Name: DICLOFENAC SODIUM 75MG  DR TABLETS] 60 tablet 0    Sig: TAKE 1 TABLET BY MOUTH TWICE DAILY FOR UP TO 14 DAYS AS NEEDED     Analgesics:  NSAIDS Failed - 06/26/2021  9:23 AM      Failed - Cr in normal range and within 360 days    No results found for: CREATININE, LABCREAU, LABCREA, POCCRE        Failed - HGB in normal range and within 360 days    Hemoglobin  Date Value Ref Range Status  01/08/2020 12.5 12.0 - 16.0 Final          Passed - Patient is not pregnant      Passed - Valid encounter within last 12 months    Recent Outpatient Visits           1 month ago Annual physical exam   [facility] [name], MD   2 months ago Nontraumatic coccydynia   [facility] [name], MD   2 months ago Nontraumatic coccydynia   [facility] [name], MD       Future Appointments             In 1 month Zigmund Daniel, Earley Abide, MD [facility]

## 2021-06-30 ENCOUNTER — Other Ambulatory Visit: Payer: Self-pay

## 2021-06-30 ENCOUNTER — Ambulatory Visit
Payer: BC Managed Care – PPO | Attending: Student in an Organized Health Care Education/Training Program | Admitting: Student in an Organized Health Care Education/Training Program

## 2021-06-30 ENCOUNTER — Encounter: Payer: Self-pay | Admitting: Student in an Organized Health Care Education/Training Program

## 2021-06-30 VITALS — Temp 97.2°F | Resp 16 | Ht 60.0 in | Wt 145.0 lb

## 2021-06-30 DIAGNOSIS — G894 Chronic pain syndrome: Secondary | ICD-10-CM | POA: Diagnosis not present

## 2021-06-30 DIAGNOSIS — M47817 Spondylosis without myelopathy or radiculopathy, lumbosacral region: Secondary | ICD-10-CM | POA: Diagnosis not present

## 2021-06-30 DIAGNOSIS — M47816 Spondylosis without myelopathy or radiculopathy, lumbar region: Secondary | ICD-10-CM | POA: Insufficient documentation

## 2021-06-30 NOTE — Progress Notes (Signed)
Patient: Phyllis Smith  Service Category: E/M  Provider: Gillis Santa, MD  DOB: [birth year]  DOS: 06/30/2021  Referring Provider: [name], MD  MRN: [id number]  Setting: Ambulatory outpatient  PCP: [name], MD  Type: New Patient  Specialty: Interventional Pain Management    Location: Office  Delivery: Face-to-face     Primary Reason(s) for Visit: Encounter for initial evaluation of one or more chronic problems (new to examiner) potentially causing chronic pain, and posing a threat to normal musculoskeletal function. (Level of risk: High) CC: Other (Coccyx pain )  HPI  Phyllis Smith is a 50 y.o. year old, female patient, who comes for the first time to our practice referred by Zigmund Daniel Earley Abide, MD for our initial evaluation of her chronic pain. She has Nontraumatic coccydynia; Spondylosis of lumbosacral region without myelopathy or radiculopathy; Annual physical exam; Chronic pain of right ankle; and Lumbar spondylosis on their problem list. Today she comes in for evaluation of her Other (Coccyx pain )  Pain Assessment: Location: Left, Right Other (Comment) (hip bilateral, right is much worse) Radiating: denies Onset: More than a month ago Duration: Chronic pain Quality: Discomfort, Constant, Aching, Sharp, Sore, Tender Severity: 6 /10 (subjective, self-reported pain score)  Effect on ADL: driving long distance aggravates the pain, unable to sit for long.  sleep disruption Timing: Constant Modifying factors: NSAIDS, PT.  laying on side.  has to lift up to turn. BP:    HR:    Onset and Duration: Present longer than 3 months Cause of pain: Unknown Severity: Getting worse, NAS-11 at its worse: 9/10, NAS-11 at its best: 5/10, NAS-11 now: 6/10, and NAS-11 on the average: 6/10 Timing: Not influenced by the time of the day Aggravating Factors: Kneeling and Prolonged sitting Alleviating Factors: Stretching and Medications Associated Problems: Numbness, Pain that wakes patient up, and  Pain that does not allow patient to sleep Quality of Pain: Aching, Feeling of constriction, and Sharp Previous Examinations or Tests: X-rays Previous Treatments: Physical Therapy, TENS, and Trigger point injections  Phyllis Smith is a pleasant 50 year old female, previously athlete and very active, who presents with a chief complaint of lower back pain right greater than left.  This also radiates into her sacrum at times.  No inciting or traumatic event.  No fall.  Does find benefit with diclofenac 75 mg twice daily.  She states that she tried to go without the medication but this resulted in increased pain.  She also takes Flexeril 5 to 10 mg as needed at bedtime.  She has done physical therapy in the past which was not helpful.  She has also done TENS unit as well as trigger point injections with Dr. Zigmund Daniel.  She is being referred here by Dr. Zigmund Daniel for further work-up and evaluation of her lower back pain.  Historic Controlled Substance Pharmacotherapy Review   Historical Monitoring: The patient  reports current drug use. Drug: Marijuana. List of all UDS Test(s): No results found for: MDMA, COCAINSCRNUR, [place], [place], [place], [place], [place] List of other Serum/Urine Drug Screening Test(s):  No results found for: AMPHSCRSER, BARBSCRSER, BENZOSCRSER, [place], COCAINSCRNUR, [place], PCPQUANT, THCSCRSER, THCU, CANNABQUANT, OPIATESCRSER, OXYSCRSER, PROPOXSCRSER, [place] on interventional pain management   Current Outpatient Medications:    acetaminophen (TYLENOL) 500 MG tablet, Take 1,000 mg by mouth every 6 (six) hours as needed., Disp: , Rfl:    cyclobenzaprine (FLEXERIL) 5 MG tablet, Take 1-2 tablets (5-10 mg total) by mouth at bedtime as needed for muscle spasms., Disp: 60  tablet, Rfl: 1   diclofenac (VOLTAREN) 75 MG EC tablet, TAKE 1 TABLET BY MOUTH TWICE DAILY FOR UP TO 14 DAYS AS NEEDED, Disp: 60 tablet, Rfl: 0   Probiotic Product (ALIGN PO), Take 2 each by mouth daily.,  Disp: , Rfl:    meloxicam (MOBIC) 7.5 MG tablet, Take 1 tablet (7.5 mg total) by mouth daily as needed for pain. (Patient not taking: Reported on 06/30/2021), Disp: 30 tablet, Rfl: 1  Imaging Review   Narrative CLINICAL DATA:  Acute on chronic low back pain.  EXAM: LUMBAR SPINE - COMPLETE 4+ VIEW  COMPARISON:  None.  FINDINGS: Five lumbar type vertebral bodies.  No acute fracture or subluxation. Vertebral body heights are preserved.  Trace anterolisthesis at L4-L5.  Intervertebral disc spaces are maintained. Severe L4-L5 and moderate L5-S1 facet arthropathy.  The sacroiliac joints are unremarkable.  IMPRESSION: 1. 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   Electronically Signed By: Titus Dubin M.D. On: 04/03/2021 10:29    Complexity Note: Imaging results reviewed. Results shared with Phyllis Smith, using Layman's terms.                         ROS  Cardiovascular: No reported cardiovascular signs or symptoms such as High blood pressure, coronary artery disease, abnormal heart rate or rhythm, heart attack, blood thinner therapy or heart weakness and/or failure Pulmonary or Respiratory: No reported pulmonary signs or symptoms such as wheezing and difficulty taking a deep full breath (Asthma), difficulty blowing air out (Emphysema), coughing up mucus (Bronchitis), persistent dry cough, or temporary stoppage of breathing during sleep Neurological: No reported neurological signs or symptoms such as seizures, abnormal skin sensations, urinary and/or fecal incontinence, being born with an abnormal open spine and/or a tethered spinal cord Psychological-Psychiatric: No reported psychological or psychiatric signs or symptoms such as difficulty sleeping, anxiety, depression, delusions or hallucinations (schizophrenial), mood swings (bipolar disorders) or suicidal ideations or attempts Gastrointestinal: No reported gastrointestinal signs or symptoms such as vomiting or evacuating  blood, reflux, heartburn, alternating episodes of diarrhea and constipation, inflamed or scarred liver, or pancreas or irrregular and/or infrequent bowel movements Genitourinary: No reported renal or genitourinary signs or symptoms such as difficulty voiding or producing urine, peeing blood, non-functioning kidney, kidney stones, difficulty emptying the bladder, difficulty controlling the flow of urine, or chronic kidney disease Hematological: No reported hematological signs or symptoms such as prolonged bleeding, low or poor functioning platelets, bruising or bleeding easily, hereditary bleeding problems, low energy levels due to low hemoglobin or being anemic Endocrine: No reported endocrine signs or symptoms such as high or low blood sugar, rapid heart rate due to high thyroid levels, obesity or weight gain due to slow thyroid or thyroid disease Rheumatologic: No reported rheumatological signs and symptoms such as fatigue, joint pain, tenderness, swelling, redness, heat, stiffness, decreased range of motion, with or without associated rash Musculoskeletal: Negative for myasthenia gravis, muscular dystrophy, multiple sclerosis or malignant hyperthermia Work History: Working full time  Allergies  Ms. Starling has No Known Allergies.  Laboratory Chemistry Profile   Renal No results found for: BUN, CREATININE, LABCREA, BCR, GFR, GFRAA, GFRNONAA, SPECGRAV, PHUR, PROTEINUR   Electrolytes No results found for: NA, K, CL, CALCIUM, MG, PHOS   Hepatic No results found for: AST, ALT, ALBUMIN, ALKPHOS, AMYLASE, LIPASE, AMMONIA   ID No results found for: LYMEIGGIGMAB, HIV, SARSCOV2NAA, STAPHAUREUS, MRSAPCR, HCVAB, PREGTESTUR, RMSFIGG, QFVRPH1IGG, QFVRPH2IGG, LYMEIGGIGMAB   Bone No results found for: VD25OH, ZO109UE4VWU,  ON6295MW4, XL2440NU2, 25OHVITD1, 25OHVITD2, 25OHVITD3, TESTOFREE, TESTOSTERONE   Endocrine Lab Results  Component Value Date   TSH 2.22 01/08/2020     Neuropathy No results found  for: VITAMINB12, FOLATE, HGBA1C, HIV   CNS No results found for: COLORCSF, APPEARCSF, RBCCOUNTCSF, WBCCSF, POLYSCSF, LYMPHSCSF, EOSCSF, PROTEINCSF, GLUCCSF, JCVIRUS, CSFOLI, IGGCSF, LABACHR, ACETBL, LABACHR, ACETBL   Inflammation (CRP: Acute  ESR: Chronic) No results found for: CRP, ESRSEDRATE, LATICACIDVEN   Rheumatology No results found for: RF, ANA, LABURIC, URICUR, LYMEIGGIGMAB, LYMEABIGMQN, HLAB27   Coagulation Lab Results  Component Value Date   PLT 264 01/08/2020     Cardiovascular Lab Results  Component Value Date   HGB 12.5 01/08/2020   HCT 40 01/08/2020     Screening No results found for: SARSCOV2NAA, COVIDSOURCE, STAPHAUREUS, MRSAPCR, HCVAB, HIV, PREGTESTUR   Cancer No results found for: CEA, CA125, LABCA2   Allergens No results found for: ALMOND, APPLE, ASPARAGUS, AVOCADO, BANANA, BARLEY, BASIL, B[place]LE[place], GREENBEAN, LIMABEAN, WHITEBEAN, BEEFIGE, REDBEET, BLUEBERRY, BROCCOLI, CABBAGE, MELON, CARROT, CASEIN, CASHEWNUT, CAULIFLOWER, CELERY     Note: Lab results reviewed.  [place]  Drug: Phyllis Smith  reports current drug use. Drug: Marijuana. Alcohol:  reports current alcohol use of about 8.0 standard drinks per week. Tobacco:  reports that she quit smoking about 10 years ago. Her smoking use included cigarettes and e-cigarettes. She has a 25.00 pack-year smoking history. She has never used smokeless tobacco. Medical:  has a past medical history of Broken ankle, right, closed, initial encounter (2014). Family: family history includes Cancer in her father; Gallbladder disease in her father; Heart disease in her maternal grandmother; Mesothelioma in her mother.  Past Surgical History:  Procedure Laterality Date   ANKLE FRACTURE SURGERY Right 2014   APPENDECTOMY  1994   CESAREAN SECTION  2001   CESAREAN SECTION  2007   ENDOMETRIAL BIOPSY  05/15/2021   Active Ambulatory Problems    Diagnosis Date Noted   Nontraumatic coccydynia 04/01/2021   Spondylosis of lumbosacral  region without myelopathy or radiculopathy 04/15/2021   Annual physical exam 05/20/2021   Chronic pain of right ankle 05/20/2021   Lumbar spondylosis 06/30/2021   Resolved Ambulatory Problems    Diagnosis Date Noted   No Resolved Ambulatory Problems   Past Medical History:  Diagnosis Date   Broken ankle, right, closed, initial encounter 2014   Constitutional Exam  General appearance: Well nourished, well developed, and well hydrated. In no apparent acute distress Vitals:   06/30/21 1327  Resp: 16  Temp: (!) 97.2 F (36.2 C)  TempSrc: Temporal  SpO2: 100%  Weight: 145 lb (65.8 kg)  Height: 5' (1.524 m)   BMI Assessment: Estimated body mass index is 28.32 kg/m as calculated from the following:   Height as of this encounter: 5' (1.524 m).   Weight as of this encounter: 145 lb (65.8 kg).  BMI interpretation table: BMI level Category Range association with higher incidence of chronic pain  <18 kg/m2 Underweight   18.5-24.9 kg/m2 Ideal body weight   25-29.9 kg/m2 Overweight Increased incidence by 20%  30-34.9 kg/m2 Obese (Class I) Increased incidence by 68%  35-39.9 kg/m2 Severe obesity (Class II) Increased incidence by 136%  >40 kg/m2 Extreme obesity (Class III) Increased incidence by 254%   Patient's current BMI Ideal Body weight  Body mass index is 28.32 kg/m. Ideal body weight: 45.5 kg (100 lb 4.9 oz) Adjusted ideal body weight: 53.6 kg (118 lb 3 oz)   BMI Readings from Last 4 Encounters:  06/30/21 28.32 kg/m  05/20/21 27.85 kg/m  04/15/21 28.04 kg/m  04/01/21 27.66 kg/m   Wt Readings from Last 4 Encounters:  06/30/21 145 lb (65.8 kg)  05/20/21 145 lb (65.8 kg)  04/15/21 146 lb (66.2 kg)  04/01/21 144 lb (65.3 kg)    Psych/Mental status: Alert, oriented x 3 (person, place, & time)       Eyes: PERLA Respiratory: No evidence of acute respiratory distress  Lumbar Spine Area Exam  Skin & Axial Inspection: No masses, redness, or swelling Alignment:  Symmetrical Functional ROM: Pain restricted ROM      right greater than left Stability: No instability detected Muscle Tone/Strength: Functionally intact. No obvious neuro-muscular anomalies detected. Sensory (Neurological): Articular pain pattern, musculoskeletal Palpation: No palpable anomalies       Provocative Tests: Hyperextension/rotation test: (+) bilaterally for facet joint pain.  Gait & Posture Assessment  Ambulation: Unassisted Gait: Relatively normal for age and body habitus Posture: WNL  Lower Extremity Exam    Side: Right lower extremity  Side: Left lower extremity  Stability: No instability observed          Stability: No instability observed          Skin & Extremity Inspection: Skin color, temperature, and hair growth are WNL. No peripheral edema or cyanosis. No masses, redness, swelling, asymmetry, or associated skin lesions. No contractures.  Skin & Extremity Inspection: Skin color, temperature, and hair growth are WNL. No peripheral edema or cyanosis. No masses, redness, swelling, asymmetry, or associated skin lesions. No contractures.  Functional ROM: Unrestricted ROM                  Functional ROM: Unrestricted ROM                  Muscle Tone/Strength: Functionally intact. No obvious neuro-muscular anomalies detected.  Muscle Tone/Strength: Functionally intact. No obvious neuro-muscular anomalies detected.  Sensory (Neurological): Unimpaired        Sensory (Neurological): Unimpaired        DTR: Patellar: deferred today Achilles: deferred today Plantar: deferred today  DTR: Patellar: deferred today Achilles: deferred today Plantar: deferred today  Palpation: No palpable anomalies  Palpation: No palpable anomalies    Assessment  Primary Diagnosis & Pertinent Problem List: The primary encounter diagnosis was Spondylosis of lumbosacral region without myelopathy or radiculopathy. Diagnoses of Lumbar facet arthropathy, Lumbar spondylosis, and Chronic pain syndrome  were also pertinent to this visit.  Visit Diagnosis (New problems to examiner): 1. Spondylosis of lumbosacral region without myelopathy or radiculopathy   2. Lumbar facet arthropathy   3. Lumbar spondylosis   4. Chronic pain syndrome    Plan of Care (Initial workup plan)   Phyllis Smith has a history of greater than 3 months of moderate to severe pain which is resulted in functional impairment.  The patient has tried various conservative therapeutic options such as NSAIDs, Tylenol, muscle relaxants, physical therapy which was inadequately effective.  Patient's pain is predominantly axial with physical exam and radiographic findings suggestive of facet arthropathy, most pronounced at L4-L5. Lumbar facet medial branch nerve blocks were discussed with the patient.  Risks and benefits were reviewed.  Patient would like to proceed with bilateral L3, L4, L5 medial branch nerve block.    Procedure Orders         LUMBAR FACET(MEDIAL BRANCH NERVE BLOCK) MBNB      Future considerations include: Lumbar MRI, diagnostic SI joint injection, piriformis  Provider-requested follow-up: Return in about 1 week (around 07/07/2021) for B/L  L3, 4, 5 Fcts , without sedation.  I spent a total of 60 minutes reviewing chart data, face-to-face evaluation with the patient, counseling and coordination of care as detailed above.   Future Appointments  Date Time Provider [place]  07/28/2021  1:00 PM [facility] PAT 7 [facility] None  08/20/2021  9:40 AM Zigmund Daniel, Earley Abide, MD [facility]    Note by: Gillis Santa, MD Date: 06/30/2021; Time: 2:02 PM

## 2021-06-30 NOTE — Progress Notes (Signed)
Safety precautions to be maintained throughout the outpatient stay will include: orient to surroundings, keep bed in low position, maintain call bell within reach at all times, provide assistance with transfer out of bed and ambulation.  

## 2021-06-30 NOTE — Patient Instructions (Signed)
____________________________________________________________________________________________  General Risks and Possible Complications  Patient Responsibilities: It is important that you read this as it is part of your informed consent. It is our duty to inform you of the risks and possible complications associated with treatments offered to you. It is your responsibility as a patient to read this and to ask questions about anything that is not clear or that you believe was not covered in this document.  Patient's Rights: You have the right to refuse treatment. You also have the right to change your mind, even after initially having agreed to have the treatment done. However, under this last option, if you wait until the last second to change your mind, you may be charged for the materials used up to that point.  Introduction: Medicine is not an exact science. Everything in Medicine, including the lack of treatment(s), carries the potential for danger, harm, or loss (which is by definition: Risk). In Medicine, a complication is a secondary problem, condition, or disease that can aggravate an already existing one. All treatments carry the risk of possible complications. The fact that a side effects or complications occurs, does not imply that the treatment was conducted incorrectly. It must be clearly understood that these can happen even when everything is done following the highest safety standards.  No treatment: You can choose not to proceed with the proposed treatment alternative. The "PRO(s)" would include: avoiding the risk of complications associated with the therapy. The "CON(s)" would include: not getting any of the treatment benefits. These benefits fall under one of three categories: diagnostic; therapeutic; and/or palliative. Diagnostic benefits include: getting information which can ultimately lead to improvement of the disease or symptom(s). Therapeutic benefits are those associated with the  successful treatment of the disease. Finally, palliative benefits are those related to the decrease of the primary symptoms, without necessarily curing the condition (example: decreasing the pain from a flare-up of a chronic condition, such as incurable terminal cancer).  General Risks and Complications: These are associated to most interventional treatments. They can occur alone, or in combination. They fall under one of the following six (6) categories: no benefit or worsening of symptoms; bleeding; infection; nerve damage; allergic reactions; and/or death. No benefits or worsening of symptoms: In Medicine there are no guarantees, only probabilities. No healthcare provider can ever guarantee that a medical treatment will work, they can only state the probability that it may. Furthermore, there is always the possibility that the condition may worsen, either directly, or indirectly, as a consequence of the treatment. Bleeding: This is more common if the patient is taking a blood thinner, either prescription or over the counter (example: Goody Powders, Fish oil, Aspirin, Garlic, etc.), or if suffering a condition associated with impaired coagulation (example: Hemophilia, cirrhosis of the liver, low platelet counts, etc.). However, even if you do not have one on these, it can still happen. If you have any of these conditions, or take one of these drugs, make sure to notify your treating physician. Infection: This is more common in patients with a compromised immune system, either due to disease (example: diabetes, cancer, human immunodeficiency virus [HIV], etc.), or due to medications or treatments (example: therapies used to treat cancer and rheumatological diseases). However, even if you do not have one on these, it can still happen. If you have any of these conditions, or take one of these drugs, make sure to notify your treating physician. Nerve Damage: This is more common when the treatment is an invasive    one, but it can also happen with the use of medications, such as those used in the treatment of cancer. The damage can occur to small secondary nerves, or to large primary ones, such as those in the spinal cord and brain. This damage may be temporary or permanent and it may lead to impairments that can range from temporary numbness to permanent paralysis and/or brain death. Allergic Reactions: Any time a substance or material comes in contact with our body, there is the possibility of an allergic reaction. These can range from a mild skin rash (contact dermatitis) to a severe systemic reaction (anaphylactic reaction), which can result in death. Death: In general, any medical intervention can result in death, most of the time due to an unforeseen complication. ____________________________________________________________________________________________ ______________________________________________________________________  Preparing for your procedure (without sedation)  Procedure appointments are limited to planned procedures: No Prescription Refills. No disability issues will be discussed. No medication changes will be discussed.  Instructions: Oral Intake: Do not eat or drink anything for at least 6 hours prior to your procedure. (Exception: Blood Pressure Medication. See below.) Transportation: Unless otherwise stated by your physician, you may drive yourself after the procedure. Blood Pressure Medicine: Do not forget to take your blood pressure medicine with a sip of water the morning of the procedure. If your Diastolic (lower reading)is above 100 mmHg, elective cases will be cancelled/rescheduled. Blood thinners: These will need to be stopped for procedures. Notify our staff if you are taking any blood thinners. Depending on which one you take, there will be specific instructions on how and when to stop it. Diabetics on insulin: Notify the staff so that you can be scheduled 1st case in the  morning. If your diabetes requires high dose insulin, take only  of your normal insulin dose the morning of the procedure and notify the staff that you have done so. Preventing infections: Shower with an antibacterial soap the morning of your procedure.  Build-up your immune system: Take 1000 mg of Vitamin C with every meal (3 times a day) the day prior to your procedure. Antibiotics: Inform the staff if you have a condition or reason that requires you to take antibiotics before dental procedures. Pregnancy: If you are pregnant, call and cancel the procedure. Sickness: If you have a cold, fever, or any active infections, call and cancel the procedure. Arrival: You must be in the facility at least 30 minutes prior to your scheduled procedure. Children: Do not bring any children with you. Dress appropriately: Bring dark clothing that you would not mind if they get stained. Valuables: Do not bring any jewelry or valuables.  Reasons to call and reschedule or cancel your procedure: (Following these recommendations will minimize the risk of a serious complication.) Surgeries: Avoid having procedures within 2 weeks of any surgery. (Avoid for 2 weeks before or after any surgery). Flu Shots: Avoid having procedures within 2 weeks of a flu shots or . (Avoid for 2 weeks before or after immunizations). Barium: Avoid having a procedure within 7-10 days after having had a radiological study involving the use of radiological contrast. (Myelograms, Barium swallow or enema study). Heart attacks: Avoid any elective procedures or surgeries for the initial 6 months after a "Myocardial Infarction" (Heart Attack). Blood thinners: It is imperative that you stop these medications before procedures. Let us know if you if you take any blood thinner.  Infection: Avoid procedures during or within two weeks of an infection (including chest colds or gastrointestinal problems). Symptoms associated with  infections include:  Localized redness, fever, chills, night sweats or profuse sweating, burning sensation when voiding, cough, congestion, stuffiness, runny nose, sore throat, diarrhea, nausea, vomiting, cold or Flu symptoms, recent or current infections. It is specially important if the infection is over the area that we intend to treat. Heart and lung problems: Symptoms that may suggest an active cardiopulmonary problem include: cough, chest pain, breathing difficulties or shortness of breath, dizziness, ankle swelling, uncontrolled high or unusually low blood pressure, and/or palpitations. If you are experiencing any of these symptoms, cancel your procedure and contact your primary care physician for an evaluation.  Remember:  Regular Business hours are:  Monday to Thursday 8:00 AM to 4:00 PM  Provider's Schedule: Milinda Pointer, MD:  Procedure days: Tuesday and Thursday 7:30 AM to 4:00 PM  Gillis Santa, MD:  Procedure days: Monday and Wednesday 7:30 AM to 4:00 PM ______________________________________________________________________  Facet Blocks Patient Information  Description: The facets are joints in the spine between the vertebrae.  Like any joints in the body, facets can become irritated and painful.  Arthritis can also effect the facets.  By injecting steroids and local anesthetic in and around these joints, we can temporarily block the nerve supply to them.  Steroids act directly on irritated nerves and tissues to reduce selling and inflammation which often leads to decreased pain.  Facet blocks may be done anywhere along the spine from the neck to the low back depending upon the location of your pain.   After numbing the skin with local anesthetic (like Novocaine), a small needle is passed onto the facet joints under x-ray guidance.  You may experience a sensation of pressure while this is being done.  The entire block usually lasts about 15-25 minutes.   Conditions which may be treated by facet  blocks:  Low back/buttock pain Neck/shoulder pain Certain types of headaches  Preparation for the injection:  Do not eat any solid food or dairy products within 8 hours of your appointment. You may drink clear liquid up to 3 hours before appointment.  Clear liquids include water, black coffee, juice or soda.  No milk or cream please. You may take your regular medication, including pain medications, with a sip of water before your appointment.  Diabetics should hold regular insulin (if taken separately) and take 1/2 normal NPH dose the morning of the procedure.  Carry some sugar containing items with you to your appointment. A driver must accompany you and be prepared to drive you home after your procedure. Bring all your current medications with you. An IV may be inserted and sedation may be given at the discretion of the physician. A blood pressure cuff, EKG and other monitors will often be applied during the procedure.  Some patients may need to have extra oxygen administered for a short period. You will be asked to provide medical information, including your allergies and medications, prior to the procedure.  We must know immediately if you are taking blood thinners (like Coumadin/Warfarin) or if you are allergic to IV iodine contrast (dye).  We must know if you could possible be pregnant.  Possible side-effects:  Bleeding from needle site Infection (rare, may require surgery) Nerve injury (rare) Numbness & tingling (temporary) Difficulty urinating (rare, temporary) Spinal headache (a headache worse with upright posture) Light-headedness (temporary) Pain at injection site (serveral days) Decreased blood pressure (rare, temporary) Weakness in arm/leg (temporary) Pressure sensation in back/neck (temporary)   Call if you experience:  Fever/chills associated with headache or  increased back/neck pain Headache worsened by an upright position New onset, weakness or numbness of an  extremity below the injection site Hives or difficulty breathing (go to the emergency room) Inflammation or drainage at the injection site(s) Severe back/neck pain greater than usual New symptoms which are concerning to you  Please note:  Although the local anesthetic injected can often make your back or neck feel good for several hours after the injection, the pain will likely return. It takes 3-7 days for steroids to work.  You may not notice any pain relief for at least one week.  If effective, we will often do a series of 2-3 injections spaced 3-6 weeks apart to maximally decrease your pain.  After the initial series, you may be a candidate for a more permanent nerve block of the facets.  If you have any questions, please call #[phone]) [facility]

## 2021-07-01 ENCOUNTER — Encounter: Payer: BC Managed Care – PPO | Admitting: Physical Therapy

## 2021-07-07 ENCOUNTER — Other Ambulatory Visit: Payer: Self-pay

## 2021-07-07 ENCOUNTER — Ambulatory Visit (HOSPITAL_BASED_OUTPATIENT_CLINIC_OR_DEPARTMENT_OTHER): Payer: BC Managed Care – PPO | Admitting: Student in an Organized Health Care Education/Training Program

## 2021-07-07 ENCOUNTER — Ambulatory Visit
Admission: RE | Admit: 2021-07-07 | Discharge: 2021-07-07 | Disposition: A | Discharge status: Home / Self Care | Payer: BC Managed Care – PPO | Source: Ambulatory Visit | Attending: Student in an Organized Health Care Education/Training Program | Admitting: Student in an Organized Health Care Education/Training Program

## 2021-07-07 ENCOUNTER — Encounter: Payer: Self-pay | Admitting: Student in an Organized Health Care Education/Training Program

## 2021-07-07 DIAGNOSIS — M47817 Spondylosis without myelopathy or radiculopathy, lumbosacral region: Secondary | ICD-10-CM | POA: Diagnosis not present

## 2021-07-07 DIAGNOSIS — M47816 Spondylosis without myelopathy or radiculopathy, lumbar region: Secondary | ICD-10-CM | POA: Diagnosis not present

## 2021-07-07 DIAGNOSIS — G894 Chronic pain syndrome: Secondary | ICD-10-CM

## 2021-07-07 MED ORDER — DIAZEPAM 5 MG PO TABS
5.0000 mg | ORAL_TABLET | ORAL | Status: AC
  Administered 2021-07-07: 09:00:00 5 mg via ORAL

## 2021-07-07 MED ORDER — LIDOCAINE HCL 2 % IJ SOLN
INTRAMUSCULAR | Status: AC
  Filled 2021-07-07: qty 10

## 2021-07-07 MED ORDER — DEXAMETHASONE SODIUM PHOSPHATE 10 MG/ML IJ SOLN
10.0000 mg | Freq: Once | INTRAMUSCULAR | Status: AC
  Administered 2021-07-07: 10:00:00 10 mg

## 2021-07-07 MED ORDER — DIAZEPAM 5 MG PO TABS
ORAL_TABLET | ORAL | Status: AC
  Filled 2021-07-07: qty 1

## 2021-07-07 MED ORDER — ROPIVACAINE HCL 2 MG/ML IJ SOLN
9.0000 mL | Freq: Once | INTRAMUSCULAR | Status: AC
  Administered 2021-07-07: 10:00:00 9 mL via PERINEURAL

## 2021-07-07 MED ORDER — LIDOCAINE HCL 2 % IJ SOLN
20.0000 mL | Freq: Once | INTRAMUSCULAR | Status: AC
  Administered 2021-07-07: 10:00:00 200 mg

## 2021-07-07 MED ORDER — ROPIVACAINE HCL 2 MG/ML IJ SOLN
INTRAMUSCULAR | Status: AC
  Filled 2021-07-07: qty 20

## 2021-07-07 MED ORDER — DEXAMETHASONE SODIUM PHOSPHATE 10 MG/ML IJ SOLN
INTRAMUSCULAR | Status: AC
  Filled 2021-07-07: qty 2

## 2021-07-07 NOTE — Patient Instructions (Signed)
Pain Management Discharge Instructions  General Discharge Instructions :  If you need to reach your doctor call: Monday-Friday 8:00 am - 4:00 pm at [phone] or toll free [phone].  After clinic hours [phone] to have operator reach doctor.  Bring all of your medication bottles to all your appointments in the pain clinic.  To cancel or reschedule your appointment with Pain Management please remember to call 24 hours in advance to avoid a fee.  Refer to the educational materials which you have been given on: General Risks, I had my Procedure. Discharge Instructions, Post Sedation.  Post Procedure Instructions:  The drugs you were given will stay in your system until tomorrow, so for the next 24 hours you should not drive, make any legal decisions or drink any alcoholic beverages.  You may eat anything you prefer, but it is better to start with liquids then soups and crackers, and gradually work up to solid foods.  Please notify your doctor immediately if you have any unusual bleeding, trouble breathing or pain that is not related to your normal pain.  Depending on the type of procedure that was done, some parts of your body may feel week and/or numb.  This usually clears up by tonight or the next day.  Walk with the use of an assistive device or accompanied by an adult for the 24 hours.  You may use ice on the affected area for the first 24 hours.  Put ice in a Ziploc bag and cover with a towel and place against area 15 minutes on 15 minutes off.  You may switch to heat after 24 hours.Facet Joint Block The facet joints connect the bones of the spine (vertebrae). They make it possible for you to bend, twist, and make other movements with your spine. They also keep you from bending too far, twisting too far, and making other extreme movements. A facet joint block is a procedure in which a numbing medicine (anesthetic) is injected into a facet joint. In many cases, an  anti-inflammatory medicine (steroid) is also injected. A facet joint block may be done: To diagnose neck or back pain. If the pain gets better after a facet joint block, it means the pain is probably coming from the facet joint. If the pain does not get better, it means the pain is probably not coming from the facet joint. To relieve neck or back pain that is caused by an inflamed facet joint. A facet joint block is only done to relieve pain if the pain does not improve with other methods, such as medicine, exercise programs, and physical therapy. Tell a health care provider about: Any allergies you have. All medicines you are taking, including vitamins, herbs, eye drops, creams, and over-the-counter medicines. Any problems you or family members have had with anesthetic medicines. Any blood disorders you have. Any surgeries you have had. Any medical conditions you have or have had. Whether you are pregnant or may be pregnant. What are the risks? Generally, this is a safe procedure. However, problems may occur, including: Bleeding. Injury to a nerve near the injection site. Pain at the injection site. Weakness or numbness in areas controlled by nerves near the injection site. Infection. Temporary fluid retention. Allergic reactions to medicines or dyes. Injury to other structures or organs near the injection site. What happens before the procedure? Medicines Ask your health care provider about: Changing or stopping your regular medicines. This is especially important if you are taking diabetes medicines or blood thinners.  Taking medicines such as aspirin and ibuprofen. These medicines can thin your blood. Do not take these medicines unless your health care provider tells you to take them. Taking over-the-counter medicines, vitamins, herbs, and supplements. Eating and drinking Follow instructions from your health care provider about eating and drinking, which may include: 8 hours before  the procedure - stop eating heavy meals or foods, such as meat, fried foods, or fatty foods. 6 hours before the procedure - stop eating light meals or foods, such as toast or cereal. 6 hours before the procedure - stop drinking milk or drinks that contain milk. 2 hours before the procedure - stop drinking clear liquids. Staying hydrated Follow instructions from your health care provider about hydration, which may include: Up to 2 hours before the procedure - you may continue to drink clear liquids, such as water, clear fruit juice, black coffee, and plain tea. General instructions Do not use any products that contain nicotine or tobacco for at least 4-6 weeks before the procedure. These products include cigarettes, e-cigarettes, and chewing tobacco. If you need help quitting, ask your health care provider. Plan to have someone take you home from the hospital or clinic. Ask your health care provider: How your surgery site will be marked. What steps will be taken to help prevent infection. These may include: Removing hair at the surgery site. Washing skin with a germ-killing soap. Receiving antibiotic medicine. What happens during the procedure?  You will put on a hospital gown. You will lie on your stomach on an X-ray table. You may be asked to lie in a different position if an injection will be made in your neck. Machines will be used to monitor your oxygen levels, heart rate, and blood pressure. Your skin will be cleaned. If an injection will be made in your neck, an IV will be inserted into one of your veins. Fluids and medicine will flow directly into your body through the IV. A numbing medicine (local anesthetic) will be applied to your skin. Your skin may sting or burn for a moment. A video X-ray machine (fluoroscopy) will be used to find the joint. In some cases, a CT scan may be used. A contrast dye may be injected into the facet joint area to help find the joint. When the joint is  located, an anesthetic will be injected into the joint through the needle. Your health care provider will ask you whether you feel pain relief. If you feel relief, a steroid may be injected to provide pain relief for a longer period of time. If you do not feel relief or feel only partial relief, additional injections of an anesthetic may be made in other facet joints. The needle will be removed. Your skin will be cleaned. A bandage (dressing) will be applied over each injection site. The procedure may vary among health care providers and hospitals. What happens after the procedure? Your blood pressure, heart rate, breathing rate, and blood oxygen level will be monitored until you leave the hospital or clinic. You will lie down and rest for a period of time. Summary A facet joint block is a procedure in which a numbing medicine (anesthetic) is injected into a facet joint. An anti-inflammatory medicine (stereoid) may also be injected. Follow instructions from your health care provider about medicines and eating and drinking before the procedure. Do not use any products that contain nicotine or tobacco for at least 4-6 weeks before the procedure. You will lie on your stomach for the  procedure, but you may be asked to lie in a different position if an injection will be made in your neck. When the joint is located, an anesthetic will be injected into the joint through the needle. This information is not intended to replace advice given to you by your health care provider. Make sure you discuss any questions you have with your health care provider. Document Revised: 10/27/2018 Document Reviewed: 06/10/2018 [employer] Patient Education  2022 [employer].

## 2021-07-07 NOTE — Progress Notes (Signed)
Safety precautions to be maintained throughout the outpatient stay will include: orient to surroundings, keep bed in low position, maintain call bell within reach at all times, provide assistance with transfer out of bed and ambulation.  

## 2021-07-07 NOTE — Progress Notes (Signed)
PROVIDER NOTE: Information contained herein reflects review and annotations entered in association with encounter. Interpretation of such information and data should be left to medically-trained personnel. Information provided to patient can be located elsewhere in the medical record under "Patient Instructions". Document created using STT-dictation technology, any transcriptional errors that may result from process are unintentional.    Patient: Phyllis Smith  Service Category: Procedure Provider: Gillis Santa, MD DOB: 1970-08-04 DOS: 07/07/2021 Location: [facility] MRN: [id number] Setting: Ambulatory - outpatient Referring Provider: Gillis Santa, MD Type: Established Patient Specialty: Interventional Pain Management PCP: [name], MD  Primary Reason for Visit: Interventional Pain Management Treatment. CC: Back Pain (lower)     Procedure:          Type: Lumbar Facet, Medial Branch Block(s) #1  Primary Purpose: Diagnostic/Therapeutic Region: Posterolateral Lumbosacral Spine Level: L3, L4, L5,Medial Branch Level(s). Injecting these levels blocks the L3-4, L4-5, lumbar facet joints. Laterality: Bilateral Anesthesia: Local (1-2% Lidocaine)  Anxiolysis: Oral 5 mg PO Valium  Sedation: None  Guidance: Fluoroscopy          Indications: 1. Spondylosis of lumbosacral region without myelopathy or radiculopathy   2. Lumbar facet arthropathy   3. Lumbar spondylosis   4. Chronic pain syndrome    Pain Score: Pre-procedure: 5 /10 Post-procedure: 0-No pain/10     Position: Prone  Pre-op H&P Assessment:  Phyllis Smith is a 50 y.o. (year old), female patient, seen today for interventional treatment. She  has a past surgical history that includes Cesarean section (2001); Appendectomy (1994); Cesarean section (2007); Ankle fracture surgery (Right, 2014); and Endometrial biopsy (05/15/2021). Phyllis Smith has a current medication list which includes the following prescription(s):  acetaminophen, cyclobenzaprine, diclofenac, and probiotic product. Her primarily concern today is the Back Pain (lower)  Initial Vital Signs:  Pulse/HCG Rate: 78ECG Heart Rate: 68 Temp:  (!) 97.2 F (36.2 C) Resp: 15 BP: 126/84 SpO2: 100 %  BMI: Estimated body mass index is 28.9 kg/m as calculated from the following:   Height as of this encounter: 5' (1.524 m).   Weight as of this encounter: 148 lb (67.1 kg).  Risk Assessment: Allergies: Reviewed. She has No Known Allergies.  Allergy Precautions: None required Coagulopathies: Reviewed. None identified.  Blood-thinner therapy: None at this time Active Infection(s): Reviewed. None identified. Phyllis Smith is afebrile  Site Confirmation: Phyllis Smith was asked to confirm the procedure and laterality before marking the site Procedure checklist: Completed Consent: Before the procedure and under the influence of no sedative(s), amnesic(s), or anxiolytics, the patient was informed of the treatment options, risks and possible complications. To fulfill our ethical and legal obligations, as recommended by the American Medical Association's Code of Ethics, I have informed the patient of my clinical impression; the nature and purpose of the treatment or procedure; the risks, benefits, and possible complications of the intervention; the alternatives, including doing nothing; the risk(s) and benefit(s) of the alternative treatment(s) or procedure(s); and the risk(s) and benefit(s) of doing nothing. The patient was provided information about the general risks and possible complications associated with the procedure. These may include, but are not limited to: failure to achieve desired goals, infection, bleeding, organ or nerve damage, allergic reactions, paralysis, and death. In addition, the patient was informed of those risks and complications associated to Spine-related procedures, such as failure to decrease pain; infection (i.e.: Meningitis, epidural or  intraspinal abscess); bleeding (i.e.: epidural hematoma, subarachnoid hemorrhage, or any other type of intraspinal or peri-dural bleeding); organ or  nerve damage (i.e.: Any type of peripheral nerve, nerve root, or spinal cord injury) with subsequent damage to sensory, motor, and/or autonomic systems, resulting in permanent pain, numbness, and/or weakness of one or several areas of the body; allergic reactions; (i.e.: anaphylactic reaction); and/or death. Furthermore, the patient was informed of those risks and complications associated with the medications. These include, but are not limited to: allergic reactions (i.e.: anaphylactic or anaphylactoid reaction(s)); adrenal axis suppression; blood sugar elevation that in diabetics may result in ketoacidosis or comma; water retention that in patients with history of congestive heart failure may result in shortness of breath, pulmonary edema, and decompensation with resultant heart failure; weight gain; swelling or edema; medication-induced neural toxicity; particulate matter embolism and blood vessel occlusion with resultant organ, and/or nervous system infarction; and/or aseptic necrosis of one or more joints. Finally, the patient was informed that Medicine is not an exact science; therefore, there is also the possibility of unforeseen or unpredictable risks and/or possible complications that may result in a catastrophic outcome. The patient indicated having understood very clearly. We have given the patient no guarantees and we have made no promises. Enough time was given to the patient to ask questions, all of which were answered to the patient's satisfaction. Phyllis Smith has indicated that she wanted to continue with the procedure. Attestation: I, the ordering provider, attest that I have discussed with the patient the benefits, risks, side-effects, alternatives, likelihood of achieving goals, and potential problems during recovery for the procedure that I have  provided informed consent. Date   Time: 07/07/2021  8:43 AM  Pre-Procedure Preparation:  Monitoring: As per clinic protocol. Respiration, ETCO2, SpO2, BP, heart rate and rhythm monitor placed and checked for adequate function Safety Precautions: Patient was assessed for positional comfort and pressure points before starting the procedure. Time-out: I initiated and conducted the "Time-out" before starting the procedure, as per protocol. The patient was asked to participate by confirming the accuracy of the "Time Out" information. Verification of the correct person, site, and procedure were performed and confirmed by me, the nursing staff, and the patient. "Time-out" conducted as per Joint Commission's Universal Protocol (UP.01.01.01). Time: 1829  Description of Procedure:          Laterality: Bilateral. The procedure was performed in identical fashion on both sides. Levels:   L3, L4, L5,  Medial Branch Level(s) Area Prepped: Posterior Lumbosacral Region DuraPrep (Iodine Povacrylex [0.7% available iodine] and Isopropyl Alcohol, 74% w/w) Safety Precautions: Aspiration looking for blood return was conducted prior to all injections. At no point did we inject any substances, as a needle was being advanced. Before injecting, the patient was told to immediately notify me if she was experiencing any new onset of "ringing in the ears, or metallic taste in the mouth". No attempts were made at seeking any paresthesias. Safe injection practices and needle disposal techniques used. Medications properly checked for expiration dates. SDV (single dose vial) medications used. After the completion of the procedure, all disposable equipment used was discarded in the proper designated medical waste containers. Local Anesthesia: Protocol guidelines were followed. The patient was positioned over the fluoroscopy table. The area was prepped in the usual manner. The time-out was completed. The target area was identified using  fluoroscopy. A 12-in long, straight, sterile hemostat was used with fluoroscopic guidance to locate the targets for each level blocked. Once located, the skin was marked with an approved surgical skin marker. Once all sites were marked, the skin (epidermis, dermis, and hypodermis),  as well as deeper tissues (fat, connective tissue and muscle) were infiltrated with a small amount of a short-acting local anesthetic, loaded on a 10cc syringe with a 25G, 1.5-in  Needle. An appropriate amount of time was allowed for local anesthetics to take effect before proceeding to the next step. Local Anesthetic: Lidocaine 2.0% The unused portion of the local anesthetic was discarded in the proper designated containers. Technical explanation of process:  L3 Medial Branch Nerve Block (MBB): The target area for the L3 medial branch is at the junction of the postero-lateral aspect of the superior articular process and the superior, posterior, and medial edge of the transverse process of L4. Under fluoroscopic guidance, a Quincke needle was inserted until contact was made with os over the superior postero-lateral aspect of the pedicular shadow (target area). After negative aspiration for blood, 29mL of the nerve block solution was injected without difficulty or complication. The needle was removed intact. L4 Medial Branch Nerve Block (MBB): The target area for the L4 medial branch is at the junction of the postero-lateral aspect of the superior articular process and the superior, posterior, and medial edge of the transverse process of L5. Under fluoroscopic guidance, a Quincke needle was inserted until contact was made with os over the superior postero-lateral aspect of the pedicular shadow (target area). After negative aspiration for blood, 65mL of the nerve block solution was injected without difficulty or complication. The needle was removed intact. L5 Medial Branch Nerve Block (MBB): The target area for the L5 medial branch is at  the junction of the postero-lateral aspect of the superior articular process and the superior, posterior, and medial edge of the sacral ala. Under fluoroscopic guidance, a Quincke needle was inserted until contact was made with os over the superior postero-lateral aspect of the pedicular shadow (target area). After negative aspiration for blood, 56mL of the nerve block solution was injected without difficulty or complication. The needle was removed intact.  Nerve block solution: 12 cc solution made of 10 cc of 0.2% ropivacaine, 2 cc of Decadron 10 mg/cc.  2 cc injected at each level above bilaterally.   Procedural Needles: 22-gauge, 3.5-inch, Quincke needles used for all levels.  Once the entire procedure was completed, the treated area was cleaned, making sure to leave some of the prepping solution back to take advantage of its long term bactericidal properties.      Illustration of the posterior view of the lumbar spine and the posterior neural structures. Laminae of L2 through S1 are labeled. DPRL5, dorsal primary ramus of L5; DPRS1, dorsal primary ramus of S1; DPR3, dorsal primary ramus of L3; FJ, facet (zygapophyseal) joint L3-L4; I, inferior articular process of L4; LB1, lateral branch of dorsal primary ramus of L1; IAB, inferior articular branches from L3 medial branch (supplies L4-L5 facet joint); IBP, intermediate branch plexus; MB3, medial branch of dorsal primary ramus of L3; NR3, third lumbar nerve root; S, superior articular process of L5; SAB, superior articular branches from L4 (supplies L4-5 facet joint also); TP3, transverse process of L3.  Vitals:   07/07/21 0849 07/07/21 0938 07/07/21 0943 07/07/21 0950  BP: 126/84 108/71 115/76 115/75  Pulse: 78     Resp: 15 18 16 18   Temp: (!) 97.2 F (36.2 C)     TempSrc: Temporal     SpO2: 100% 99% 98% 100%  Weight: 148 lb (67.1 kg)     Height: 5' (1.524 m)        Start Time: 0938 hrs. End  Time: 0950 hrs.  Imaging Guidance (Spinal):           Type of Imaging Technique: Fluoroscopy Guidance (Spinal) Indication(s): Assistance in needle guidance and placement for procedures requiring needle placement in or near specific anatomical locations not easily accessible without such assistance. Exposure Time: Please see nurses notes. Contrast: None used. Fluoroscopic Guidance: I was personally present during the use of fluoroscopy. "Tunnel Vision Technique" used to obtain the best possible view of the target area. Parallax error corrected before commencing the procedure. "Direction-depth-direction" technique used to introduce the needle under continuous pulsed fluoroscopy. Once target was reached, antero-posterior, oblique, and lateral fluoroscopic projection used confirm needle placement in all planes. Images permanently stored in EMR. Interpretation: No contrast injected. I personally interpreted the imaging intraoperatively. Adequate needle placement confirmed in multiple planes. Permanent images saved into the patient's record.   Post-operative Assessment:  Post-procedure Vital Signs:  Pulse/HCG Rate: 7880 Temp:  (!) 97.2 F (36.2 C) Resp: 18 BP: 115/75 SpO2: 100 %  EBL: None  Complications: No immediate post-treatment complications observed by team, or reported by patient.  Note: The patient tolerated the entire procedure well. A repeat set of vitals were taken after the procedure and the patient was kept under observation following institutional policy, for this type of procedure. Post-procedural neurological assessment was performed, showing return to baseline, prior to discharge. The patient was provided with post-procedure discharge instructions, including a section on how to identify potential problems. Should any problems arise concerning this procedure, the patient was given instructions to immediately contact us, at any time, without hesitation. In any case, we plan to contact the patient by telephone for a follow-up  status report regarding this interventional procedure.  Comments:  No additional relevant information.  5 out of 5 strength bilateral lower extremity: Plantar flexion, dorsiflexion, knee flexion, knee extension.   Plan of Care  Orders:  Orders Placed This Encounter  Procedures   DG PAIN [facility] C-ARM 1-60 MIN NO REPORT    Intraoperative interpretation by procedural physician at [place].    Standing Status:   Standing    Number of Occurrences:   1    Order Specific Question:   Reason for exam:    Answer:   Assistance in needle guidance and placement for procedures requiring needle placement in or near specific anatomical locations not easily accessible without such assistance.     Medications ordered for procedure: Meds ordered this encounter  Medications   lidocaine (XYLOCAINE) 2 % (with pres) injection 400 mg   diazepam (VALIUM) tablet 5 mg    Make sure Flumazenil is available in the pyxis when using this medication. If oversedation occurs, administer 0.2 mg IV over 15 sec. If after 45 sec no response, administer 0.2 mg again over 1 min; may repeat at 1 min intervals; not to exceed 4 doses (1 mg)   dexamethasone (DECADRON) injection 10 mg   dexamethasone (DECADRON) injection 10 mg   ropivacaine (PF) 2 mg/mL (0.2%) (NAROPIN) injection 9 mL   ropivacaine (PF) 2 mg/mL (0.2%) (NAROPIN) injection 9 mL   Medications administered: We administered lidocaine, diazepam, dexamethasone, dexamethasone, ropivacaine (PF) 2 mg/mL (0.2%), and ropivacaine (PF) 2 mg/mL (0.2%).  See the medical record for exact dosing, route, and time of administration.  Follow-up plan:   Return in about 4 weeks (around 08/04/2021) for Post Procedure Evaluation, virtual.     Recent Visits Date Type Provider Dept  06/30/21 Office Visit Gillis Santa, MD [facility]  Showing recent visits within past 90 days and meeting all other requirements Today's Visits Date Type Provider Dept   07/07/21 Procedure visit Gillis Santa, MD [facility]  Showing today's visits and meeting all other requirements Future Appointments Date Type Provider Dept  08/04/21 Appointment Gillis Santa, MD [facility]  Showing future appointments within next 90 days and meeting all other requirements  Disposition: Discharge home  Discharge (Date   Time): 07/07/2021; 1000 hrs.   Primary Care Physician: [name], MD Location: [facility] Note by: Gillis Santa, MD Date: 07/07/2021; Time: 10:53 AM  Disclaimer:  Medicine is not an exact science. The only guarantee in medicine is that nothing is guaranteed. It is important to note that the decision to proceed with this intervention was based on the information collected from the patient. The Data and conclusions were drawn from the patient's questionnaire, the interview, and the physical examination. Because the information was provided in large part by the patient, it cannot be guaranteed that it has not been purposely or unconsciously manipulated. Every effort has been made to obtain as much relevant data as possible for this evaluation. It is important to note that the conclusions that lead to this procedure are derived in large part from the available data. Always take into account that the treatment will also be dependent on availability of resources and existing treatment guidelines, considered by other Pain Management Practitioners as being common knowledge and practice, at the time of the intervention. For Medico-Legal purposes, it is also important to point out that variation in procedural techniques and pharmacological choices are the acceptable norm. The indications, contraindications, technique, and results of the above procedure should only be interpreted and judged by a Board-Certified Interventional Pain Specialist with extensive familiarity and expertise in the same exact procedure and  technique.

## 2021-07-08 ENCOUNTER — Telehealth: Payer: Self-pay

## 2021-07-08 ENCOUNTER — Encounter: Payer: BC Managed Care – PPO | Admitting: Physical Therapy

## 2021-07-08 NOTE — Telephone Encounter (Signed)
Post procedure phone call. Patient states she is doing good.  

## 2021-07-15 ENCOUNTER — Encounter: Payer: BC Managed Care – PPO | Admitting: Physical Therapy

## 2021-07-15 DIAGNOSIS — N939 Abnormal uterine and vaginal bleeding, unspecified: Secondary | ICD-10-CM | POA: Diagnosis not present

## 2021-07-15 DIAGNOSIS — N9489 Other specified conditions associated with female genital organs and menstrual cycle: Secondary | ICD-10-CM | POA: Diagnosis not present

## 2021-07-15 DIAGNOSIS — D259 Leiomyoma of uterus, unspecified: Secondary | ICD-10-CM | POA: Diagnosis not present

## 2021-07-16 ENCOUNTER — Encounter (HOSPITAL_BASED_OUTPATIENT_CLINIC_OR_DEPARTMENT_OTHER): Payer: Self-pay | Admitting: Obstetrics and Gynecology

## 2021-07-16 ENCOUNTER — Other Ambulatory Visit: Payer: Self-pay

## 2021-07-16 DIAGNOSIS — Z01812 Encounter for preprocedural laboratory examination: Secondary | ICD-10-CM | POA: Diagnosis not present

## 2021-07-16 NOTE — Progress Notes (Signed)
Spoke w/ via phone for pre-op interview---pt Lab needs dos----  urine preg   per anesthesia surgery orders pending          Lab results------lab appt 07-28-2021  COVID test -----patient states asymptomatic no test needed Arrive at -------930 am 07-30-2021 NPO after MN NO Solid Food.  Clear liquids from MN until---830 am Med rec completed Medications to take morning of surgery -----none Diabetic medication -----n/a Patient instructed no nail polish to be worn day of surgery Patient instructed to bring photo id and insurance card day of surgery Patient aware to have Driver (ride ) / caregiver    for 24 hours after surgery spouse joseph Herbold Patient Special Instructions -----pt given extended recovery instructions Pre-Op special Istructions -----none Patient verbalized understanding of instructions that were given at this phone interview. Patient denies shortness of breath, chest pain, fever, cough at this phone interview.

## 2021-07-16 NOTE — Progress Notes (Signed)
PLEASE WEAR A MASK OUT IN PUBLIC AND SOCIAL DISTANCE AND [place] YOUR HANDS FREQUENTLY. PLEASE ASK ALL YOUR CLOSE HOUSEHOLD CONTACT TO WEAR MASK OUT IN PUBLIC AND SOCIAL DISTANCE AND [place] HANDS FREQUENTLY ALSO.      Your procedure is scheduled on 07-30-2021  Report to [facility].   Call this number if you have problems the morning of surgery  :[phone].   OUR ADDRESS IS [place].  WE ARE LOCATED IN THE [facility].  PLEASE BRING YOUR INSURANCE CARD AND PHOTO ID DAY OF SURGERY.  ONLY ONE PERSON ALLOWED IN FACILITY WAITING AREA.                                     REMEMBER:  DO NOT EAT FOOD, CANDY GUM OR MINTS  AFTER MIDNIGHT THE NIGHT BEFORE YOUR SURGERY . YOU MAY HAVE CLEAR LIQUIDS FROM MIDNIGHT THE NIGHT BEFORE YOUR SURGERY UNTIL 830 AM. NO CLEAR LIQUIDS AFTER  830 AM DAY OF SURGERY.   YOU MAY  BRUSH YOUR TEETH MORNING OF SURGERY AND RINSE YOUR MOUTH OUT, NO CHEWING GUM CANDY OR MINTS.    CLEAR LIQUID DIET   Foods Allowed                                                                     Foods Excluded  Coffee and tea, regular and decaf                             liquids that you cannot  Plain Jell-O any favor except red or purple                                           see through such as: Fruit ices (not with fruit pulp)                                     milk, soups, orange juice  Iced Popsicles                                    All solid food Carbonated beverages, regular and diet                                    Cranberry, grape and apple juices Sports drinks like Gatorade Lightly seasoned clear broth or consume(fat free) Sugar  Sample Menu Breakfast                                Lunch  Supper Cranberry juice                                           Jell-O                                     Grape juice                           Apple juice Coffee or tea                         Jell-O                                      Popsicle                                                Coffee or tea                        Coffee or tea  _____________________________________________________________________     TAKE THESE MEDICATIONS MORNING OF SURGERY WITH A SIP OF WATER: NONE  ONE VISITOR IS ALLOWED IN WAITING ROOM ONLY DAY OF SURGERY.  YOU MAY HAVE ANOTHER PERSON SWITCH OUT WITH THE  1  VISITOR IN THE WAITING ROOM DAY OF SURGERY AND A MASK MUST BE WORN IN THE WAITING ROOM.    2 VISITORS  MAY VISIT IN THE EXTENDED RECOVERY ROOM UNTIL 800 PM ONLY 1 VISITOR AGE 80 AND OVER MAY SPEND THE NIGHT AND MUST BE IN EXTENDED RECOVERY ROOM NO LATER THAN 800 PM .    UP TO 2 CHILDREN AGE 7 TO 15 MAY ALSO VISIT IN EXTENDED RECOV ERY ROOM ONLY UNTIL 800 PM AND MUST LEAVE BY 800 PM. ALL PERSONS VISITING IN EXTENDED RECOVERY ROOM MUST WEAR A MASK.                                    DO NOT WEAR JEWERLY, MAKE UP. DO NOT WEAR LOTIONS, POWDERS, PERFUMES OR NAIL POLISH ON YOUR FINGERNAILS. TOENAIL POLISH IS OK TO WEAR. DO NOT SHAVE FOR 48 HOURS PRIOR TO DAY OF SURGERY. MEN MAY SHAVE FACE AND NECK. CONTACTS, GLASSES, OR DENTURES MAY NOT BE WORN TO SURGERY.                                    [facility] [place] IS NOT RESPONSIBLE  FOR ANY BELONGINGS.                                                                    [facility]  [facility] Preparing for Surgery Before surgery, you can play an important role.  Because skin is not sterile, your skin needs to be as free of germs as possible.  You can reduce the number of germs on your skin by washing with CHG (chlorahexidine gluconate) soap before surgery.  CHG is an antiseptic cleaner which kills germs and bonds with the skin to continue killing germs even after washing. Please DO NOT use if you have an allergy to CHG or antibacterial soaps.  If your skin becomes reddened/irritated stop using the CHG and inform your nurse when you arrive at [facility]. Do not shave (including legs and underarms) for at least 48 hours prior to the first CHG shower.  You may shave your face/neck. Please follow these instructions carefully:  1.  Shower with CHG Soap the night before surgery and the  morning of Surgery.  2.  If you choose to wash your hair, wash your hair first as usual with your  normal  shampoo.  3.  After you shampoo, rinse your hair and body thoroughly to remove the  shampoo.                            4.  Use CHG as you would any other liquid soap.  You can apply chg directly  to the skin and wash                      Gently with a scrungie or clean washcloth.  5.  Apply the CHG Soap to your body ONLY FROM THE NECK DOWN.   Do not use on face/ open                           Wound or open sores. Avoid contact with eyes, ears mouth and genitals (private parts).                       Wash face,  Genitals (private parts) with your normal soap.             6.  Wash thoroughly, paying special attention to the area where your surgery  will be performed.  7.  Thoroughly rinse your body with warm water from the neck down.  8.  DO NOT shower/wash with your normal soap after using and rinsing off  the CHG Soap.                9.  Pat yourself dry with a clean towel.            10.  Wear clean pajamas.            11.  Place clean sheets on your bed the night of your first shower and do not  sleep with pets. Day of Surgery : Do not apply any lotions/deodorants the morning of surgery.  Please wear clean clothes to the hospital/surgery center.  IF YOU HAVE ANY SKIN IRRITATION OR PROBLEMS WITH THE SURGICAL SOAP, PLEASE GET A BAR OF GOLD DIAL SOAP AND SHOWER THE NIGHT BEFORE YOUR SURGERY AND THE MORNING OF YOUR SURGERY. PLEASE LET THE NURSE KNOW MORNING OF YOUR SURGERY IF YOU HAD ANY PROBLEMS WITH THE SURGICAL SOAP.  FAILURE TO FOLLOW THESE INSTRUCTIONS MAY RESULT IN THE CANCELLATION OF YOUR SURGERY PATIENT  SIGNATURE_________________________________  NURSE SIGNATURE__________________________________  ________________________________________________________________________  QUESTIONS CALL [facility] PRE OP NURSE PHONE [phone].

## 2021-07-28 ENCOUNTER — Encounter (HOSPITAL_COMMUNITY)
Admission: RE | Admit: 2021-07-28 | Discharge: 2021-07-28 | Disposition: A | Discharge status: Home / Self Care | Payer: BC Managed Care – PPO | Source: Ambulatory Visit | Attending: Obstetrics and Gynecology | Admitting: Obstetrics and Gynecology

## 2021-07-28 ENCOUNTER — Other Ambulatory Visit: Payer: Self-pay | Admitting: Obstetrics and Gynecology

## 2021-07-28 ENCOUNTER — Other Ambulatory Visit: Payer: Self-pay

## 2021-07-28 DIAGNOSIS — N939 Abnormal uterine and vaginal bleeding, unspecified: Secondary | ICD-10-CM

## 2021-07-28 DIAGNOSIS — Z01812 Encounter for preprocedural laboratory examination: Secondary | ICD-10-CM | POA: Insufficient documentation

## 2021-07-28 DIAGNOSIS — Z01818 Encounter for other preprocedural examination: Secondary | ICD-10-CM

## 2021-07-28 LAB — CBC
HCT: 37.5 % (ref 36.0–46.0)
Hemoglobin: 12.6 g/dL (ref 12.0–15.0)
MCH: 30.1 pg (ref 26.0–34.0)
MCHC: 33.6 g/dL (ref 30.0–36.0)
MCV: 89.7 fL (ref 80.0–100.0)
Platelets: 327 10*3/uL (ref 150–400)
RBC: 4.18 MIL/uL (ref 3.87–5.11)
RDW: 13.1 % (ref 11.5–15.5)
WBC: 7.3 10*3/uL (ref 4.0–10.5)
nRBC: 0 % (ref 0.0–0.2)

## 2021-07-28 NOTE — H&P (Deleted)
  The note originally documented on this encounter has been moved the the encounter in which it belongs.  

## 2021-07-28 NOTE — H&P (Signed)
Chief Complaint(s):   Preop/ Abnormal uterine bleeding and Fibroids   HPI:  General 51 yo presents for a pre-op visit for a robotic-assisted laparoscopic hysterectomy with bilateral salpingectomy scheduled for Jan 11th, 2023 at 11:15 am to manage abnormal bleeding and uterine fibroids.  Ultrasound Results Completed on October 27th 2022  Uterus: 10.3 cm X 5.4 cm X 6.9 cm  Endometrium: 2.67 cm  L Ovary: complex cyst, avascular, 3.2 cm  R Ovary: normal  Findings: hypoechoic mass seen completely in endometrial cavity measuring 3.5 cm X 2.8 cm X 2.5 cm  Pt was seen on 04/22/2021 to discuss fibroids/cysts. Pt reported she has been experiencing irregular vaginal bleeding for a little over a year that has gotten worse. Pt reported her last period was the beginning of September. Pt reported she would wear 2 super plus tampons at a time with a pad. She reported she would change these 2 tampons and pad every 2 hours. She reported her cycle lasts 9 days. 6 of these days are heavy. Pt reported after the 9 days she will still bleed. She reported only 2 days out of the month she doesn't bleed.  Pt had a benign EMB on Oct 27th, 2022.  TODAY:  Pt advised she may stay overnight, or 2 days if conversion to larger incision. Discussed risks of hysterectomy including but not limited to infection, bleeding, conversion to larger incision, damage to her bowel, bladder, or ureters, with the need for further surgery. Discussed risk of blood transfusion and risk of HIV or hep B&C with blood transfusion. Pt is aware of risks and desires blood transfusion if needed. Pt advised to avoid NSAIDs (Aspirin, Aleve, Advil, Ibuprofen, Motrin) from now until surgery given risk of bleeding during surgery. She may take Tylenol for pain management. She is advised to avoid eating or drinking starting midnight prior to surgery. Discussed post-surgery avoidance of driving for 1 week and avoidance of lifting weight greater than 10 lbs or  intercourse for 6-8 weeks after procedure. Current Medication: Taking  Align Prebiotic-Probiotic 5-1.25 MG-GM Tablet Chewable as directed Orally.   Discontinued  Diclofenac Sodium 75 MG Tablet Delayed Release 1 tablet as needed Orally Twice a day.     Medication List reviewed and reconciled with the patient.  Medical History:  Medical History Verified.      Allergies/Intolerance: N.K.D.A. Gyn History:  Sexual activity currently sexually active. Periods : irregular,. LMP 03/2021-continuous. Birth control [id number] during cesarean section.. Last pap smear date 2 year ago. Last mammogram date 08/2019- normal per pt. Denies Abnormal pap smear. Denies STD.   OB History:  Number of pregnancies 2. Pregnancy # 1 live birth, C-section delivery, boy. Pregnancy # 2 live birth, C-section, girl.   Surgical History:  C-section x2 btl in 2007 during cesarean section 2001, 2007     Rigth ankle surgery 2014     Appendectomy 1992   Hospitalization:  Childbirth x2   Family History:  Father: alive 19 yrs, bladder cancer?     Mother: deceased, mesothelioma    Paternal [place] Father: deceased    Paternal Grand Mother: deceased, heart condition?     Maternal Grand Father: deceased    Maternal Grand Mother: deceased, heart condition    1 brother(s) , 2 sister(s) - healthy. 1 son(s) , 1 daughter(s) - healthy.    Maternal aunt- breast cancer.  Social History: General Tobacco use cigarettes: Former smoker, Quit in year 2016, Tobacco history last updated 07/15/2021.  Alcohol: yes, 2+ per week.  Caffeine: yes, 1 serving daily.  no Recreational drug use.  DIET: regular.  Exercise: yes, walks.  Marital Status: married.  Children: 2.  ROS: Negative except as stated in HPI.  Objective: Vitals: Wt 150.0, Wt change 2.2 lbs, Ht 60, BMI 29.29, Pulse sitting 84, BP sitting 112/77.  Past Results: Examination:  General Examination alert, oriented, NAD" label="CONSTITUTIONAL:" categoryPropId="[id number]"  examid="[id number]"CONSTITUTIONAL: alert, oriented, NAD.  moist, warm" label="SKIN:" categoryPropId="10109" examid="[id number]"SKIN: moist, warm.  Conjunctiva clear" label="EYES:" categoryPropId="21468" examid="[id number]"EYES: Conjunctiva clear.  LUNGS: good I:E efffort noted , clear to auscultation bilaterally.  HEART: heart sounds are normal, rhythm is regular, no murmur.  soft, non-tender/non-distended, bowel sounds present" label="ABDOMEN:" categoryPropId="88" examid="[id number]"ABDOMEN: soft, non-tender/non-distended, bowel sounds present.  normal external genitalia, labia - unremarkable, vagina - pink moist mucosa, no lesions or abnormal discharge, cervix - no discharge or lesions or CMT, adnexa - no masses or tenderness, uterus - nontender and normal size on palpation" label="FEMALE GENITOURINARY:" categoryPropId="13414" examid="[id number]"FEMALE GENITOURINARY: normal external genitalia, labia - unremarkable, vagina - pink moist mucosa, no lesions or abnormal discharge, cervix - no discharge or lesions or CMT, adnexa - no masses or tenderness, uterus - nontender and normal size on palpation.  affect normal, good eye contact" label="PSYCH:" categoryPropId="[id number]" examid="[id number]"PSYCH: affect normal, good eye contact.  Physical Examination: Chaperone present Chaperone present Beather Arbour 07/15/2021 12:24:36 PM > , for pelvic exam.    Assessment: Assessment:  Abnormal uterine bleeding - N93.9 (Primary)     Fibroids - D25.9     Endometrial mass - N94.89     Plan: Treatment: Abnormal uterine bleeding Notes: Planning robotic-assisted laparoscopic hysterectomy with bilateral salpingectomy. Pt advised she may stay overnight, or 2 days if conversion to larger incision. Discussed risks of hysterectomy including but not limited to infection, bleeding, conversion to larger incision, damage to her bowel, bladder, or ureters, with the need for further surgery. Discussed risk of blood transfusion and risk of HIV or hep  B&C with blood transfusion. Pt is aware of risks and desires blood transfusion if needed. Pt advised to avoid NSAIDs (Aspirin, Aleve, Advil, Ibuprofen, Motrin) from now until surgery given risk of bleeding during surgery. She may take Tylenol for pain management. She is advised to avoid eating or drinking starting midnight prior to surgery. Discussed post-surgery avoidance of driving for 1 week and avoidance of lifting weight greater than 10 lbs or intercourse for 6-8 weeks after procedure. Fibroids Notes: Planning robotic-assisted laparoscopic hysterectomy with bilateral salpingectomy. Pt advised she may stay overnight, or 2 days if conversion to larger incision. Discussed risks of hysterectomy including but not limited to infection, bleeding, conversion to larger incision, damage to her bowel, bladder, or ureters, with the need for further surgery. Discussed risk of blood transfusion and risk of HIV or hep B&C with blood transfusion. Pt is aware of risks and desires blood transfusion if needed. Pt advised to avoid NSAIDs (Aspirin, Aleve, Advil, Ibuprofen, Motrin) from now until surgery given risk of bleeding during surgery. She may take Tylenol for pain management. She is advised to avoid eating or drinking starting midnight prior to surgery. Discussed post-surgery avoidance of driving for 1 week and avoidance of lifting weight greater than 10 lbs or intercourse for 6-8 weeks after procedure. Endometrial mass Notes: Planning robotic-assisted laparoscopic hysterectomy with bilateral salpingectomy. Pt advised she may stay overnight, or 2 days if conversion to larger incision. Discussed risks of hysterectomy including but not limited to infection, bleeding, conversion to larger incision, damage to her bowel, bladder, or ureters, with the need  for further surgery. Discussed risk of blood transfusion and risk of HIV or hep B&C with blood transfusion. Pt is aware of risks and desires blood transfusion if needed. Pt  advised to avoid NSAIDs (Aspirin, Aleve, Advil, Ibuprofen, Motrin) from now until surgery given risk of bleeding during surgery. She may take Tylenol for pain management. She is advised to avoid eating or drinking starting midnight prior to surgery. Discussed post-surgery avoidance of driving for 1 week and avoidance of lifting weight greater than 10 lbs or intercourse for 6-8 weeks after procedure.

## 2021-07-30 ENCOUNTER — Observation Stay (HOSPITAL_BASED_OUTPATIENT_CLINIC_OR_DEPARTMENT_OTHER): Payer: BC Managed Care – PPO | Admitting: Anesthesiology

## 2021-07-30 ENCOUNTER — Encounter (HOSPITAL_BASED_OUTPATIENT_CLINIC_OR_DEPARTMENT_OTHER): Payer: Self-pay | Admitting: Obstetrics and Gynecology

## 2021-07-30 ENCOUNTER — Observation Stay (HOSPITAL_BASED_OUTPATIENT_CLINIC_OR_DEPARTMENT_OTHER)
Admission: RE | Admit: 2021-07-30 | Discharge: 2021-07-31 | Disposition: A | Discharge status: Home / Self Care | Payer: BC Managed Care – PPO | Attending: Obstetrics and Gynecology | Admitting: Obstetrics and Gynecology

## 2021-07-30 ENCOUNTER — Encounter (HOSPITAL_BASED_OUTPATIENT_CLINIC_OR_DEPARTMENT_OTHER)
Admission: RE | Disposition: A | Discharge status: Home / Self Care | Payer: Self-pay | Source: Home / Self Care | Attending: Obstetrics and Gynecology

## 2021-07-30 DIAGNOSIS — N852 Hypertrophy of uterus: Secondary | ICD-10-CM | POA: Diagnosis not present

## 2021-07-30 DIAGNOSIS — D252 Subserosal leiomyoma of uterus: Secondary | ICD-10-CM | POA: Diagnosis not present

## 2021-07-30 DIAGNOSIS — N939 Abnormal uterine and vaginal bleeding, unspecified: Secondary | ICD-10-CM | POA: Diagnosis not present

## 2021-07-30 DIAGNOSIS — N8003 Adenomyosis of the uterus: Secondary | ICD-10-CM | POA: Insufficient documentation

## 2021-07-30 DIAGNOSIS — N72 Inflammatory disease of cervix uteri: Secondary | ICD-10-CM | POA: Insufficient documentation

## 2021-07-30 DIAGNOSIS — D251 Intramural leiomyoma of uterus: Secondary | ICD-10-CM | POA: Diagnosis not present

## 2021-07-30 DIAGNOSIS — Z87891 Personal history of nicotine dependence: Secondary | ICD-10-CM | POA: Insufficient documentation

## 2021-07-30 DIAGNOSIS — N9489 Other specified conditions associated with female genital organs and menstrual cycle: Secondary | ICD-10-CM | POA: Insufficient documentation

## 2021-07-30 DIAGNOSIS — D250 Submucous leiomyoma of uterus: Principal | ICD-10-CM | POA: Insufficient documentation

## 2021-07-30 DIAGNOSIS — D271 Benign neoplasm of left ovary: Secondary | ICD-10-CM | POA: Diagnosis not present

## 2021-07-30 DIAGNOSIS — M47816 Spondylosis without myelopathy or radiculopathy, lumbar region: Secondary | ICD-10-CM

## 2021-07-30 DIAGNOSIS — N83292 Other ovarian cyst, left side: Secondary | ICD-10-CM | POA: Diagnosis not present

## 2021-07-30 DIAGNOSIS — D259 Leiomyoma of uterus, unspecified: Secondary | ICD-10-CM | POA: Diagnosis not present

## 2021-07-30 DIAGNOSIS — N736 Female pelvic peritoneal adhesions (postinfective): Secondary | ICD-10-CM | POA: Diagnosis not present

## 2021-07-30 DIAGNOSIS — Z01818 Encounter for other preprocedural examination: Secondary | ICD-10-CM

## 2021-07-30 HISTORY — DX: Abnormal uterine and vaginal bleeding, unspecified: N93.9

## 2021-07-30 HISTORY — PX: ROBOTIC ASSISTED LAPAROSCOPIC HYSTERECTOMY AND SALPINGECTOMY: SHX6379

## 2021-07-30 HISTORY — DX: Presence of spectacles and contact lenses: Z97.3

## 2021-07-30 LAB — TYPE AND SCREEN
ABO/RH(D): AB POS
Antibody Screen: NEGATIVE

## 2021-07-30 LAB — POCT PREGNANCY, URINE: Preg Test, Ur: NEGATIVE

## 2021-07-30 LAB — ABO/RH: ABO/RH(D): AB POS

## 2021-07-30 SURGERY — XI ROBOTIC ASSISTED LAPAROSCOPIC HYSTERECTOMY AND SALPINGECTOMY
Anesthesia: General | Site: Abdomen

## 2021-07-30 MED ORDER — ACETAMINOPHEN 500 MG PO TABS
ORAL_TABLET | ORAL | Status: AC
  Filled 2021-07-30: qty 2

## 2021-07-30 MED ORDER — OXYCODONE HCL 5 MG PO TABS: 5.0000 mg | ORAL_TABLET | Freq: Once | ORAL | Status: DC | PRN

## 2021-07-30 MED ORDER — ONDANSETRON HCL 4 MG/2ML IJ SOLN
INTRAMUSCULAR | Status: AC
  Filled 2021-07-30: qty 2

## 2021-07-30 MED ORDER — FENTANYL CITRATE (PF) 250 MCG/5ML IJ SOLN
INTRAMUSCULAR | Status: AC
  Filled 2021-07-30: qty 5

## 2021-07-30 MED ORDER — ZOLPIDEM TARTRATE 5 MG PO TABS: 5.0000 mg | ORAL_TABLET | Freq: Every evening | ORAL | Status: DC | PRN

## 2021-07-30 MED ORDER — ACETAMINOPHEN 500 MG PO TABS
1000.0000 mg | ORAL_TABLET | ORAL | Status: AC
  Administered 2021-07-30: 1000 mg via ORAL

## 2021-07-30 MED ORDER — ROCURONIUM BROMIDE 10 MG/ML (PF) SYRINGE
PREFILLED_SYRINGE | INTRAVENOUS | Status: AC
  Filled 2021-07-30: qty 10

## 2021-07-30 MED ORDER — IBUPROFEN 600 MG PO TABS
600.0000 mg | ORAL_TABLET | Freq: Four times a day (QID) | ORAL | 1 refills | Status: AC | PRN
Start: 2021-07-30 — End: ?

## 2021-07-30 MED ORDER — SODIUM CHLORIDE 0.9 % IV SOLN
2.0000 g | INTRAVENOUS | Status: AC
  Administered 2021-07-30: 2 g via INTRAVENOUS

## 2021-07-30 MED ORDER — PROPOFOL 10 MG/ML IV BOLUS
INTRAVENOUS | Status: DC | PRN
  Administered 2021-07-30: 150 mg via INTRAVENOUS

## 2021-07-30 MED ORDER — ACETAMINOPHEN 500 MG PO TABS
1000.0000 mg | ORAL_TABLET | Freq: Four times a day (QID) | ORAL | Status: DC
  Administered 2021-07-30 – 2021-07-31 (×3): 1000 mg via ORAL

## 2021-07-30 MED ORDER — KETOROLAC TROMETHAMINE 30 MG/ML IJ SOLN
INTRAMUSCULAR | Status: DC | PRN
  Administered 2021-07-30: 30 mg via INTRAVENOUS

## 2021-07-30 MED ORDER — ONDANSETRON HCL 4 MG PO TABS: 4.0000 mg | ORAL_TABLET | Freq: Four times a day (QID) | ORAL | Status: DC | PRN

## 2021-07-30 MED ORDER — KETOROLAC TROMETHAMINE 30 MG/ML IJ SOLN
30.0000 mg | Freq: Once | INTRAMUSCULAR | Status: AC
  Administered 2021-07-30: 30 mg via INTRAVENOUS

## 2021-07-30 MED ORDER — PROMETHAZINE HCL 25 MG/ML IJ SOLN: 6.2500 mg | INTRAMUSCULAR | Status: DC | PRN

## 2021-07-30 MED ORDER — HYDROMORPHONE HCL 1 MG/ML IJ SOLN
INTRAMUSCULAR | Status: DC | PRN
Start: 2021-07-30 — End: 2021-07-30
  Administered 2021-07-30 (×2): 1 mg via INTRAVENOUS

## 2021-07-30 MED ORDER — POVIDONE-IODINE 10 % EX SWAB: 2.0000 "application " | Freq: Once | CUTANEOUS | Status: DC

## 2021-07-30 MED ORDER — ONDANSETRON HCL 4 MG/2ML IJ SOLN
INTRAMUSCULAR | Status: DC | PRN
Start: 2021-07-30 — End: 2021-07-30
  Administered 2021-07-30: 4 mg via INTRAVENOUS

## 2021-07-30 MED ORDER — OXYCODONE HCL 5 MG PO TABS
ORAL_TABLET | ORAL | Status: AC
  Filled 2021-07-30: qty 1

## 2021-07-30 MED ORDER — SIMETHICONE 80 MG PO CHEW: 80.0000 mg | CHEWABLE_TABLET | Freq: Four times a day (QID) | ORAL | Status: DC | PRN

## 2021-07-30 MED ORDER — LIDOCAINE 2% (20 MG/ML) 5 ML SYRINGE
INTRAMUSCULAR | Status: DC | PRN
  Administered 2021-07-30: 100 mg via INTRAVENOUS

## 2021-07-30 MED ORDER — GABAPENTIN 100 MG PO CAPS
100.0000 mg | ORAL_CAPSULE | Freq: Once | ORAL | Status: AC
  Administered 2021-07-30: 100 mg via ORAL

## 2021-07-30 MED ORDER — IBUPROFEN 200 MG PO TABS
600.0000 mg | ORAL_TABLET | Freq: Four times a day (QID) | ORAL | Status: DC
  Administered 2021-07-31 (×2): 600 mg via ORAL

## 2021-07-30 MED ORDER — ACETAMINOPHEN 500 MG PO TABS: 1000.0000 mg | ORAL_TABLET | Freq: Three times a day (TID) | ORAL | 0 refills | Status: AC | PRN

## 2021-07-30 MED ORDER — SODIUM CHLORIDE 0.9 % IR SOLN
Status: DC | PRN
  Administered 2021-07-30 (×2): 1000 mL

## 2021-07-30 MED ORDER — HEMOSTATIC AGENTS (NO CHARGE) OPTIME
TOPICAL | Status: DC | PRN
  Administered 2021-07-30: 1

## 2021-07-30 MED ORDER — LACTATED RINGERS IV SOLN: INTRAVENOUS | Status: DC

## 2021-07-30 MED ORDER — PANTOPRAZOLE SODIUM 40 MG PO TBEC
DELAYED_RELEASE_TABLET | ORAL | Status: AC
  Filled 2021-07-30: qty 1

## 2021-07-30 MED ORDER — SENNA 8.6 MG PO TABS
ORAL_TABLET | ORAL | Status: AC
  Filled 2021-07-30: qty 1

## 2021-07-30 MED ORDER — SUGAMMADEX SODIUM 200 MG/2ML IV SOLN
INTRAVENOUS | Status: DC | PRN
  Administered 2021-07-30: 200 mg via INTRAVENOUS

## 2021-07-30 MED ORDER — STERILE WATER FOR IRRIGATION IR SOLN
Status: DC | PRN
Start: 2021-07-30 — End: 2021-07-30
  Administered 2021-07-30: 500 mL

## 2021-07-30 MED ORDER — ONDANSETRON HCL 4 MG/2ML IJ SOLN: 4.0000 mg | Freq: Four times a day (QID) | INTRAMUSCULAR | Status: DC | PRN

## 2021-07-30 MED ORDER — GABAPENTIN 100 MG PO CAPS
ORAL_CAPSULE | ORAL | Status: AC
  Filled 2021-07-30: qty 1

## 2021-07-30 MED ORDER — PROPOFOL 10 MG/ML IV BOLUS
INTRAVENOUS | Status: AC
  Filled 2021-07-30: qty 20

## 2021-07-30 MED ORDER — MENTHOL 3 MG MT LOZG: 1.0000 | LOZENGE | OROMUCOSAL | Status: DC | PRN

## 2021-07-30 MED ORDER — PANTOPRAZOLE SODIUM 40 MG PO TBEC
40.0000 mg | DELAYED_RELEASE_TABLET | Freq: Every day | ORAL | Status: DC
  Administered 2021-07-30: 40 mg via ORAL

## 2021-07-30 MED ORDER — HYDROMORPHONE HCL 1 MG/ML IJ SOLN: 0.2000 mg | INTRAMUSCULAR | Status: DC | PRN

## 2021-07-30 MED ORDER — DIPHENHYDRAMINE HCL 50 MG/ML IJ SOLN
12.5000 mg | INTRAMUSCULAR | Status: DC | PRN
  Administered 2021-07-30: 12.5 mg via INTRAVENOUS

## 2021-07-30 MED ORDER — MIDAZOLAM HCL 2 MG/2ML IJ SOLN
INTRAMUSCULAR | Status: AC
  Filled 2021-07-30: qty 2

## 2021-07-30 MED ORDER — MIDAZOLAM HCL 5 MG/5ML IJ SOLN
INTRAMUSCULAR | Status: DC | PRN
  Administered 2021-07-30: 2 mg via INTRAVENOUS

## 2021-07-30 MED ORDER — OXYCODONE HCL 5 MG PO TABS: 5.0000 mg | ORAL_TABLET | ORAL | 0 refills | Status: DC | PRN

## 2021-07-30 MED ORDER — SCOPOLAMINE 1 MG/3DAYS TD PT72
MEDICATED_PATCH | TRANSDERMAL | Status: AC
  Filled 2021-07-30: qty 1

## 2021-07-30 MED ORDER — SCOPOLAMINE 1 MG/3DAYS TD PT72
1.0000 | MEDICATED_PATCH | TRANSDERMAL | Status: DC
  Administered 2021-07-30: 1.5 mg via TRANSDERMAL

## 2021-07-30 MED ORDER — AMISULPRIDE (ANTIEMETIC) 5 MG/2ML IV SOLN: 10.0000 mg | Freq: Once | INTRAVENOUS | Status: DC | PRN

## 2021-07-30 MED ORDER — HYDROMORPHONE HCL 2 MG/ML IJ SOLN
INTRAMUSCULAR | Status: AC
  Filled 2021-07-30: qty 1

## 2021-07-30 MED ORDER — DEXAMETHASONE SODIUM PHOSPHATE 10 MG/ML IJ SOLN
INTRAMUSCULAR | Status: DC | PRN
Start: 2021-07-30 — End: 2021-07-30
  Administered 2021-07-30: 10 mg via INTRAVENOUS

## 2021-07-30 MED ORDER — SODIUM CHLORIDE 0.9 % IV SOLN
INTRAVENOUS | Status: AC
  Filled 2021-07-30: qty 2

## 2021-07-30 MED ORDER — OXYCODONE HCL 5 MG/5ML PO SOLN: 5.0000 mg | Freq: Once | ORAL | Status: DC | PRN

## 2021-07-30 MED ORDER — DEXAMETHASONE SODIUM PHOSPHATE 10 MG/ML IJ SOLN
INTRAMUSCULAR | Status: AC
  Filled 2021-07-30: qty 1

## 2021-07-30 MED ORDER — METHYLENE BLUE 1 % INJ SOLN
INTRAVENOUS | Status: DC | PRN
  Administered 2021-07-30: 5 mL

## 2021-07-30 MED ORDER — FENTANYL CITRATE (PF) 100 MCG/2ML IJ SOLN
INTRAMUSCULAR | Status: DC | PRN
  Administered 2021-07-30 (×2): 50 ug via INTRAVENOUS
  Administered 2021-07-30: 100 ug via INTRAVENOUS
  Administered 2021-07-30: 50 ug via INTRAVENOUS

## 2021-07-30 MED ORDER — KETOROLAC TROMETHAMINE 30 MG/ML IJ SOLN
INTRAMUSCULAR | Status: AC
  Filled 2021-07-30: qty 1

## 2021-07-30 MED ORDER — ROCURONIUM BROMIDE 10 MG/ML (PF) SYRINGE
PREFILLED_SYRINGE | INTRAVENOUS | Status: DC | PRN
  Administered 2021-07-30: 10 mg via INTRAVENOUS
  Administered 2021-07-30: 20 mg via INTRAVENOUS
  Administered 2021-07-30: 100 mg via INTRAVENOUS

## 2021-07-30 MED ORDER — SODIUM CHLORIDE 0.9 % IV SOLN
INTRAVENOUS | Status: DC | PRN
  Administered 2021-07-30: 10 mL
  Administered 2021-07-30: 80 mL

## 2021-07-30 MED ORDER — FENTANYL CITRATE (PF) 100 MCG/2ML IJ SOLN: 25.0000 ug | INTRAMUSCULAR | Status: DC | PRN

## 2021-07-30 MED ORDER — ALUM & MAG HYDROXIDE-SIMETH 200-200-20 MG/5ML PO SUSP: 30.0000 mL | ORAL | Status: DC | PRN

## 2021-07-30 MED ORDER — LIDOCAINE 2% (20 MG/ML) 5 ML SYRINGE
INTRAMUSCULAR | Status: AC
  Filled 2021-07-30: qty 5

## 2021-07-30 MED ORDER — DIPHENHYDRAMINE HCL 50 MG/ML IJ SOLN
INTRAMUSCULAR | Status: AC
  Filled 2021-07-30: qty 1

## 2021-07-30 MED ORDER — SENNA 8.6 MG PO TABS
1.0000 | ORAL_TABLET | Freq: Two times a day (BID) | ORAL | Status: DC
  Administered 2021-07-30: 8.6 mg via ORAL

## 2021-07-30 MED ORDER — BISACODYL 5 MG PO TBEC
5.0000 mg | DELAYED_RELEASE_TABLET | Freq: Every day | ORAL | Status: DC | PRN
  Filled 2021-07-30: qty 1

## 2021-07-30 MED ORDER — OXYCODONE HCL 5 MG PO TABS
5.0000 mg | ORAL_TABLET | ORAL | Status: DC | PRN
  Administered 2021-07-30 (×2): 5 mg via ORAL
  Administered 2021-07-31: 10 mg via ORAL
  Administered 2021-07-31: 5 mg via ORAL

## 2021-07-30 SURGICAL SUPPLY — 64 items
ADH SKN CLS APL DERMABOND .7 (GAUZE/BANDAGES/DRESSINGS) ×1
APL SRG 38 LTWT LNG FL B (MISCELLANEOUS) ×1
APPLICATOR ARISTA FLEXITIP XL (MISCELLANEOUS) ×1 IMPLANT
BAG DECANTER FOR FLEXI CONT (MISCELLANEOUS) ×1 IMPLANT
CATH FOLEY 3WAY  5CC 16FR (CATHETERS) ×1
CATH FOLEY 3WAY 5CC 16FR (CATHETERS) ×1 IMPLANT
COVER BACK TABLE 60X90IN (DRAPES) ×2 IMPLANT
COVER TIP SHEARS 8 DVNC (MISCELLANEOUS) ×1 IMPLANT
COVER TIP SHEARS 8MM DA VINCI (MISCELLANEOUS) ×1
DECANTER SPIKE VIAL GLASS SM (MISCELLANEOUS) ×4 IMPLANT
DEFOGGER SCOPE WARMER CLEARIFY (MISCELLANEOUS) ×2 IMPLANT
DERMABOND ADVANCED (GAUZE/BANDAGES/DRESSINGS) ×1
DERMABOND ADVANCED .7 DNX12 (GAUZE/BANDAGES/DRESSINGS) ×1 IMPLANT
DILATOR CANAL MILEX (MISCELLANEOUS) ×2 IMPLANT
DRAPE ARM DVNC X/XI (DISPOSABLE) ×4 IMPLANT
DRAPE COLUMN DVNC XI (DISPOSABLE) ×1 IMPLANT
DRAPE DA VINCI XI ARM (DISPOSABLE) ×4
DRAPE DA VINCI XI COLUMN (DISPOSABLE) ×1
DRAPE UTILITY 15X26 TOWEL STRL (DRAPES) ×2 IMPLANT
DURAPREP 26ML APPLICATOR (WOUND CARE) ×2 IMPLANT
ELECT REM PT RETURN 9FT ADLT (ELECTROSURGICAL) ×2
ELECTRODE REM PT RTRN 9FT ADLT (ELECTROSURGICAL) ×1 IMPLANT
GAUZE 4X4 16PLY ~~LOC~~+RFID DBL (SPONGE) ×4 IMPLANT
GLOVE SURG ENC TEXT LTX SZ6.5 (GLOVE) ×7 IMPLANT
GLOVE SURG POLYISO LF SZ7 (GLOVE) ×2 IMPLANT
GLOVE SURG POLYISO LF SZ7.5 (GLOVE) ×2 IMPLANT
GLOVE SURG UNDER POLY LF SZ6.5 (GLOVE) ×12 IMPLANT
GLOVE SURG UNDER POLY LF SZ7 (GLOVE) ×3 IMPLANT
HEMOSTAT ARISTA ABSORB 3G PWDR (HEMOSTASIS) ×1 IMPLANT
IRRIG SUCT STRYKERFLOW 2 WTIP (MISCELLANEOUS) ×2
IRRIGATION SUCT STRKRFLW 2 WTP (MISCELLANEOUS) ×1 IMPLANT
IV NS 1000ML (IV SOLUTION) ×4
IV NS 1000ML BAXH (IV SOLUTION) IMPLANT
KIT TURNOVER CYSTO (KITS) ×2 IMPLANT
LEGGING LITHOTOMY PAIR STRL (DRAPES) ×2 IMPLANT
MANIFOLD NEPTUNE II (INSTRUMENTS) ×1 IMPLANT
OBTURATOR OPTICAL STANDARD 8MM (TROCAR)
OBTURATOR OPTICAL STND 8 DVNC (TROCAR)
OBTURATOR OPTICALSTD 8 DVNC (TROCAR) IMPLANT
OCCLUDER COLPOPNEUMO (BALLOONS) ×2 IMPLANT
PACK ROBOT WH (CUSTOM PROCEDURE TRAY) ×2 IMPLANT
PACK ROBOTIC GOWN (GOWN DISPOSABLE) ×2 IMPLANT
PACK TRENDGUARD 450 HYBRID PRO (MISCELLANEOUS) IMPLANT
PAD OB MATERNITY 4.3X12.25 (PERSONAL CARE ITEMS) ×2 IMPLANT
PAD PREP 24X48 CUFFED NSTRL (MISCELLANEOUS) ×2 IMPLANT
PROTECTOR NERVE ULNAR (MISCELLANEOUS) ×2 IMPLANT
SCISSORS LAP 5X45 EPIX DISP (ENDOMECHANICALS) ×1 IMPLANT
SEAL CANN UNIV 5-8 DVNC XI (MISCELLANEOUS) ×4 IMPLANT
SEAL XI 5MM-8MM UNIVERSAL (MISCELLANEOUS) ×4
SEALER VESSEL DA VINCI XI (MISCELLANEOUS) ×1
SEALER VESSEL EXT DVNC XI (MISCELLANEOUS) IMPLANT
SET IRRIG Y TYPE TUR BLADDER L (SET/KITS/TRAYS/PACK) ×1 IMPLANT
SET TRI-LUMEN FLTR TB AIRSEAL (TUBING) ×1 IMPLANT
SPONGE T-LAP 4X18 ~~LOC~~+RFID (SPONGE) ×2 IMPLANT
SUT VIC AB 0 CT1 27 (SUTURE) ×4
SUT VIC AB 0 CT1 27XBRD ANBCTR (SUTURE) ×2 IMPLANT
SUT VICRYL RAPIDE 4/0 PS 2 (SUTURE) ×5 IMPLANT
SUT VLOC 180 0 9IN  GS21 (SUTURE) ×1
SUT VLOC 180 0 9IN GS21 (SUTURE) ×1 IMPLANT
TIP UTERINE 6.7X8CM BLUE DISP (MISCELLANEOUS) ×1 IMPLANT
TOWEL OR 17X26 10 PK STRL BLUE (TOWEL DISPOSABLE) ×2 IMPLANT
TRENDGUARD 450 HYBRID PRO PACK (MISCELLANEOUS) ×2
TROCAR PORT AIRSEAL 8X120 (TROCAR) ×2 IMPLANT
WATER STERILE IRR 500ML POUR (IV SOLUTION) ×1 IMPLANT

## 2021-07-30 NOTE — H&P (Signed)
Date of Initial H&P: 07/30/2021  History reviewed, patient examined, no change in status, stable for surgery.

## 2021-07-30 NOTE — Anesthesia Preprocedure Evaluation (Signed)
Anesthesia Evaluation  Patient identified by MRN, date of birth, ID band Patient awake    Reviewed: Allergy & Precautions, NPO status , Patient's Chart, lab work & pertinent test results  Airway Mallampati: II  TM Distance: >3 FB Neck ROM: Full    Dental no notable dental hx.    Pulmonary neg pulmonary ROS, former smoker,    Pulmonary exam normal breath sounds clear to auscultation       Cardiovascular Exercise Tolerance: Good negative cardio ROS Normal cardiovascular exam Rhythm:Regular Rate:Normal     Neuro/Psych negative neurological ROS  negative psych ROS   GI/Hepatic negative GI ROS, (+)     substance abuse  marijuana use,   Endo/Other  negative endocrine ROS  Renal/GU negative Renal ROS  negative genitourinary   Musculoskeletal  (+) Arthritis , Chronic coccyx pain   Abdominal   Peds negative pediatric ROS (+)  Hematology negative hematology ROS (+)   Anesthesia Other Findings   Reproductive/Obstetrics negative OB ROS                             Anesthesia Physical Anesthesia Plan  ASA: 2  Anesthesia Plan: General   Post-op Pain Management: Tylenol PO (pre-op)   Induction: Intravenous  PONV Risk Score and Plan: 3 and Midazolam, Scopolamine patch - Pre-op, Treatment may vary due to age or medical condition, Dexamethasone and Ondansetron  Airway Management Planned: Oral ETT  Additional Equipment: None  Intra-op Plan:   Post-operative Plan: Extubation in OR  Informed Consent: I have reviewed the patients History and Physical, chart, labs and discussed the procedure including the risks, benefits and alternatives for the proposed anesthesia with the patient or authorized representative who has indicated his/her understanding and acceptance.     Dental advisory given  Plan Discussed with: CRNA, Anesthesiologist and Surgeon  Anesthesia Plan Comments:          Anesthesia Quick Evaluation

## 2021-07-30 NOTE — Anesthesia Procedure Notes (Signed)
Procedure Name: Intubation Date/Time: 07/30/2021 12:02 PM Performed by: Rogers Blocker, CRNA Pre-anesthesia Checklist: Patient identified, Emergency Drugs available, Suction available and Patient being monitored Patient Re-evaluated:Patient Re-evaluated prior to induction Oxygen Delivery Method: Circle System Utilized Preoxygenation: Pre-oxygenation with 100% oxygen Induction Type: IV induction Ventilation: Mask ventilation without difficulty Laryngoscope Size: Mac and 3 Grade View: Grade I Tube type: Oral Tube size: 7.0 mm Number of attempts: 1 Airway Equipment and Method: Stylet and Bite block Placement Confirmation: ETT inserted through vocal cords under direct vision, positive ETCO2 and breath sounds checked- equal and bilateral Secured at: 21 cm Tube secured with: Tape Dental Injury: Teeth and Oropharynx as per pre-operative assessment

## 2021-07-30 NOTE — Transfer of Care (Signed)
Immediate Anesthesia Transfer of Care Note  Patient: Megin Lederman  Procedure(s) Performed: XI ROBOTIC ASSISTED LAPAROSCOPIC HYSTERECTOMY, BILATERAL SALPINGECTOMY AND LEFT OOPHORECTOMY (Abdomen)  Patient Location: PACU  Anesthesia Type:General  Level of Consciousness: drowsy, patient cooperative and responds to stimulation  Airway & Oxygen Therapy: Patient Spontanous Breathing and Patient connected to face mask oxygen  Post-op Assessment: Report given to RN and Post -op Vital signs reviewed and stable  Post vital signs: Reviewed and stable  Last Vitals:  Vitals Value Taken Time  BP 110/99 07/30/21 1415  Temp    Pulse 84 07/30/21 1419  Resp 18 07/30/21 1419  SpO2 100 % 07/30/21 1419  Vitals shown include unvalidated device data.  Last Pain:  Vitals:   07/30/21 1020  TempSrc: Oral  PainSc: 3       Patients Stated Pain Goal: 4 (83/67/25 5001)  Complications: No notable events documented.

## 2021-07-30 NOTE — Op Note (Signed)
07/30/2021  3:00 PM  PATIENT:  Phyllis Smith  51 y.o. female  PRE-OPERATIVE DIAGNOSIS:  Abnormal Uterine Bleeding Fibroids Endometrial Mass  POST-OPERATIVE DIAGNOSIS:  Abnormal Uterine Bleeding Fibroids Endometrial Mass  PROCEDURE:  Procedure(s) with comments: XI ROBOTIC ASSISTED LAPAROSCOPIC HYSTERECTOMY, BILATERAL SALPINGECTOMY AND LEFT OOPHORECTOMY (N/A) - vaginal and abdominal sites  SURGEON:  Surgeon(s) and Role:    Christophe Louis, MD - Primary  PHYSICIAN ASSISTANT: None  ASSISTANTS: Gaylord Shih RNFA assisted due to complexity of the anatomy    ANESTHESIA:   general  EBL:  50 mL   BLOOD ADMINISTERED:none  DRAINS: Urinary Catheter (Foley)   LOCAL MEDICATIONS USED:  OTHER ropivicaine   SPECIMEN:  Source of Specimen:  uterus cervix bilateral fallopian tubes and left ovary   DISPOSITION OF SPECIMEN:  PATHOLOGY  COUNTS:  YES  TOURNIQUET:  * No tourniquets in log *  DICTATION: .Note written in EPIC  PLAN OF CARE: Admit for overnight observation  PATIENT DISPOSITION:  PACU - hemodynamically stable.   Delay start of Pharmacological VTE agent (>24hrs) due to surgical blood loss or risk of bleeding: not applicable  Findings: normal external genitalia, vaginal mucosa and cervix.mildly  Enlarged uterus.  Complex cyst on the left ovary. Adhesion of the colon to the right ovary.   Procedure: The patient was taken to the operating room where she was placed under general anesthesia.Time out was performed. Marland Kitchen She was placed in dorsal lithotomy position and prepped and draped in the usual sterile fashion. A weighted speculum was placed into the vagina. A Deaver was placed anteriorly for retraction. The anterior lip of the cervix was grasped with a single-tooth tenaculum. The vaginal mucosa was injected with 2.5 cc of ropivacaine at the 2/4/ 8 and 10 o'clock positions. The uterus was sounded to 8 cm. the cervix was dilated to 6 mm . 0 vicryl suture placed at the 12 and 6:00 positions  Of the cervix to facilitate placement of a Ru mi uterine manipulator. The manipulator was placed without difficulty. Weighted speculum and Deaver were removed .  Attention was turned to the patient's abdomen where a 8 mm trocar was placed 2 cm above the umbilicus. under direct visualization . The pneumoperitoneum was achieved with PCO2 gas. The laparoscope was removed. 60 cc of ropivacaine were injected into the abdominal cavity. The laparoscope was reinserted. An 8 mm trocar was placed in the right upper quadrant 16 centimeters from the umbilicus.later connected to robotic arm #4). An 8MM incision was made in the Right upper quadrant TROCAR WAS PLACED 8 cm from the umbilicus. Later connected to robotic arm #3. An 8 mm incision was made in the left upper quadrant 16 cm from the umbilicus and connected to robot arm #1. Marland Kitchen Attention was turned to the left upper quadrant where a 8 mm midclavicular assistant trocar was placed. ( All incision sites were injected with 10cc of ropivacaine prior to port placement. )  Once all ports had been placed under direct visualization.The laparoscope was removed and the [employer] robotic system was then right-sided docked. The robotic arms were connected to the corresponding trocars as listed above. The laparoscope was then reinserted. The long tip bipolar forceps were placed into port #1. The vessel sealer (monopolar scissor )placed in the port #3. A prograsp was placed in port #4. All instruments were directed into the pelvis under direct visualization.  Attention was turned to the surgeons console.. The left infundibulo-pelvic ligament  cauterized and transected with the vessel sealer  The broad ligament was cauterized and transected with the vessel sealer .The round ligament was cauterized and transected with the vessel sealer  The anterior leaf of broad ligament was incised along the bladder reflection to the midline.  The right  mesosalpinx and right utero-ovarian ligament was  cauterized and transected with the vessel sealer. The right broad ligament was cauterized and transected with the vessel sealer. The right round ligament was cauterized and transected with the vessel sealer The broad ligament was incised to the midline.  The bladder was filled in a retrograde fashion with saline stained with methylene blue. The bladder was dissected off the lower uterine segments of the cervix via sharp and blunt dissection.   The uterine arteries were skeleton bilaterally. They were  cauterized and transected with the vessel sealer The KOH ring was identified. The anterior colpotomy was performed followed by the posterior colpotomy. Once the uterus,cervix and bilateral fallopian tubes were completely excised was removed through the vagina. The  bipolar forceps and scissors were removed and cobra forceps  were placed in the port #1 and the mega needle driver was placed in to port #3.  The vaginal cuff was closed with running suture if 0 v-lock. The pelvis was irrigated. Marland KitchenMarland KitchenMarland KitchenExcellent hemostasis was noted. Arista was placed along the vaginal cuff.  All pelvic pedicles were examined and hemostasis was noted.  All instruments removed from the ports. All ports were removed under direct Visualization. The pneumoperitoneum was released. The skin incisions were closed with 4-0 Vicryl and then covered with Derma bond.     Sponge lap and needle counts weIre correct x. The patient was awakened from anesthesia and taken to the recovery room in stable condition.

## 2021-07-31 ENCOUNTER — Telehealth: Payer: Self-pay

## 2021-07-31 ENCOUNTER — Encounter (HOSPITAL_BASED_OUTPATIENT_CLINIC_OR_DEPARTMENT_OTHER): Payer: Self-pay | Admitting: Obstetrics and Gynecology

## 2021-07-31 DIAGNOSIS — N939 Abnormal uterine and vaginal bleeding, unspecified: Secondary | ICD-10-CM | POA: Diagnosis not present

## 2021-07-31 DIAGNOSIS — N72 Inflammatory disease of cervix uteri: Secondary | ICD-10-CM | POA: Diagnosis not present

## 2021-07-31 DIAGNOSIS — D252 Subserosal leiomyoma of uterus: Secondary | ICD-10-CM | POA: Diagnosis not present

## 2021-07-31 DIAGNOSIS — D250 Submucous leiomyoma of uterus: Secondary | ICD-10-CM | POA: Diagnosis not present

## 2021-07-31 DIAGNOSIS — D271 Benign neoplasm of left ovary: Secondary | ICD-10-CM | POA: Diagnosis not present

## 2021-07-31 DIAGNOSIS — Z87891 Personal history of nicotine dependence: Secondary | ICD-10-CM | POA: Diagnosis not present

## 2021-07-31 DIAGNOSIS — N9489 Other specified conditions associated with female genital organs and menstrual cycle: Secondary | ICD-10-CM | POA: Diagnosis not present

## 2021-07-31 DIAGNOSIS — D251 Intramural leiomyoma of uterus: Secondary | ICD-10-CM | POA: Diagnosis not present

## 2021-07-31 DIAGNOSIS — N8003 Adenomyosis of the uterus: Secondary | ICD-10-CM | POA: Diagnosis not present

## 2021-07-31 LAB — CBC
HCT: 37.6 % (ref 36.0–46.0)
Hemoglobin: 12.4 g/dL (ref 12.0–15.0)
MCH: 30 pg (ref 26.0–34.0)
MCHC: 33 g/dL (ref 30.0–36.0)
MCV: 90.8 fL (ref 80.0–100.0)
Platelets: 293 10*3/uL (ref 150–400)
RBC: 4.14 MIL/uL (ref 3.87–5.11)
RDW: 13.2 % (ref 11.5–15.5)
WBC: 10.4 10*3/uL (ref 4.0–10.5)
nRBC: 0 % (ref 0.0–0.2)

## 2021-07-31 MED ORDER — IBUPROFEN 200 MG PO TABS
ORAL_TABLET | ORAL | Status: AC
  Filled 2021-07-31: qty 3

## 2021-07-31 MED ORDER — OXYCODONE HCL 5 MG PO TABS
ORAL_TABLET | ORAL | Status: AC
  Filled 2021-07-31: qty 2

## 2021-07-31 MED ORDER — OXYCODONE HCL 5 MG PO TABS
ORAL_TABLET | ORAL | Status: AC
  Filled 2021-07-31: qty 1

## 2021-07-31 MED ORDER — ACETAMINOPHEN 500 MG PO TABS
ORAL_TABLET | ORAL | Status: AC
  Filled 2021-07-31: qty 2

## 2021-07-31 NOTE — Telephone Encounter (Signed)
LM for patient to call office for pre virtual appointment questions.  

## 2021-07-31 NOTE — Anesthesia Postprocedure Evaluation (Signed)
Anesthesia Post Note  Patient: [name]  Procedure(s) Performed: XI ROBOTIC ASSISTED LAPAROSCOPIC HYSTERECTOMY, BILATERAL SALPINGECTOMY AND LEFT OOPHORECTOMY (Abdomen)     Patient location during evaluation: PACU Anesthesia Type: General Level of consciousness: awake and alert Pain management: pain level controlled Vital Signs Assessment: post-procedure vital signs reviewed and stable Respiratory status: spontaneous breathing, nonlabored ventilation, respiratory function stable and patient connected to nasal cannula oxygen Cardiovascular status: blood pressure returned to baseline and stable Postop Assessment: no apparent nausea or vomiting Anesthetic complications: no   No notable events documented.  Last Vitals:  Vitals:   07/31/21 0630 07/31/21 0806  BP: 103/68 102/61  Pulse: 62 66  Resp: 16 16  Temp: 36.8 C 37.2 C  SpO2: 97% 98%    Last Pain:  Vitals:   07/31/21 0806  TempSrc:   PainSc: [place]

## 2021-08-01 LAB — SURGICAL PATHOLOGY

## 2021-08-04 ENCOUNTER — Ambulatory Visit
Payer: BC Managed Care – PPO | Attending: Student in an Organized Health Care Education/Training Program | Admitting: Student in an Organized Health Care Education/Training Program

## 2021-08-04 ENCOUNTER — Encounter: Payer: Self-pay | Admitting: Student in an Organized Health Care Education/Training Program

## 2021-08-04 ENCOUNTER — Other Ambulatory Visit: Payer: Self-pay

## 2021-08-04 DIAGNOSIS — M47816 Spondylosis without myelopathy or radiculopathy, lumbar region: Secondary | ICD-10-CM

## 2021-08-04 DIAGNOSIS — G894 Chronic pain syndrome: Secondary | ICD-10-CM | POA: Diagnosis not present

## 2021-08-04 DIAGNOSIS — M47817 Spondylosis without myelopathy or radiculopathy, lumbosacral region: Secondary | ICD-10-CM | POA: Diagnosis not present

## 2021-08-04 NOTE — Progress Notes (Signed)
Patient: Phyllis Smith  Service Category: E/M  Provider: Gillis Santa, MD  DOB: 05/23/1971  DOS: 08/04/2021  Location: Office  MRN: [id number]  Setting: Ambulatory outpatient  Referring Provider: [name], MD  Type: Established Patient  Specialty: Interventional Pain Management  PCP: Phyllis Culver, MD  Location: Remote location  Delivery: TeleHealth     Virtual Encounter - Pain Management PROVIDER NOTE: Information contained herein reflects review and annotations entered in association with encounter. Interpretation of such information and data should be left to medically-trained personnel. Information provided to patient can be located elsewhere in the medical record under "Patient Instructions". Document created using STT-dictation technology, any transcriptional errors that may result from process are unintentional.    Contact & Pharmacy Preferred: [phone] Home: [phone] (home) Mobile: [phone] (mobile) E-mail: [email or web address]  Phyllis Smith [phone] Phyllis Smith, [place] [address] AT [place] [address] Phone: [phone] Fax: [phone]  [id number] - [place], [place] [address] AT [address] [place] [address] Phone: [phone] Fax: [phone]   Pre-screening  Phyllis Smith offered "in-person" vs "virtual" encounter. She indicated preferring virtual for this encounter.   Reason COVID-19*   Social distancing based on CDC and AMA recommendations.   I contacted Phyllis Smith on 08/04/2021 via telephone.      I clearly identified myself as Phyllis Santa, MD. I verified that I was speaking with the correct person using two identifiers (Name: Phyllis Smith, and date of birth: 1971/04/24).  Consent I sought verbal advanced consent from Phyllis Smith for virtual visit interactions. I informed Phyllis Smith of possible security and privacy concerns,  risks, and limitations associated with providing "not-in-person" medical evaluation and management services. I also informed Phyllis Smith of the availability of "in-person" appointments. Finally, I informed her that there would be a charge for the virtual visit and that she could be  personally, fully or partially, financially responsible for it. Phyllis Smith expressed understanding and agreed to proceed.   Historic Elements   Phyllis Smith is a 51 y.o. year old, female patient evaluated today after our last contact on 07/07/2021. Phyllis Smith  has a past medical history of Abnormal uterine bleeding (AUB), Broken ankle, right, closed, initial encounter (2014), History of COVID-19 (01/2020), and Wears glasses. She also  has a past surgical history that includes Cesarean section (2001); Appendectomy (1994); Cesarean section (2007); Ankle fracture surgery (Right, 2014); Endometrial biopsy (05/15/2021); and Robotic assisted laparoscopic hysterectomy and salpingectomy (N/A, 07/30/2021). Phyllis Smith has a current medication list which includes the following prescription(s): acetaminophen, ibuprofen, oxycodone, and probiotic product. She  reports that she quit smoking about 11 years ago. Her smoking use included cigarettes and e-cigarettes. She has a 25.00 pack-year smoking history. She has never used smokeless tobacco. She reports current alcohol use of about 8.0 standard drinks per week. She reports current drug use. Drug: Marijuana. Phyllis Smith has No Known Allergies.   HPI  Today, she is being contacted for a post-procedure assessment.   Post-procedure evaluation     Procedure:          Type: Lumbar Facet, Medial Branch Block(s) #1  Primary Purpose: Diagnostic/Therapeutic Region: Posterolateral Lumbosacral Spine Level: L3, L4, L5,Medial Branch Level(s). Injecting these levels blocks the L3-4, L4-5, lumbar facet joints. Laterality: Bilateral Anesthesia: Local (1-2% Lidocaine)  Anxiolysis: Oral 5 mg PO Valium  Sedation: None  Guidance: Fluoroscopy          Indications: 1. Spondylosis of lumbosacral region without myelopathy or radiculopathy   2. Lumbar facet arthropathy   3. Lumbar spondylosis   4. Chronic pain syndrome    Pain Score: Pre-procedure: 5 /10 Post-procedure: 0-No pain/10      Effectiveness:  Initial hour after procedure: 100 %  Subsequent 4-6 hours post-procedure: 100 %  Analgesia past initial 6 hours: 100 % (began feeling increased pain at day 6 or 7)  Ongoing improvement:  Analgesic:  25-30% Function: Phyllis Smith reports improvement in function ROM: Back to baseline   Assessment  The primary encounter diagnosis was Spondylosis of lumbosacral region without myelopathy or radiculopathy. Diagnoses of Lumbar facet arthropathy, Lumbar spondylosis, and Chronic pain syndrome were also pertinent to this visit.  Plan of Care   Postprocedural follow-up after first set of diagnostic lumbar facet medial branch nerve blocks bilaterally at L3, L4, L5.  Patient endorses 100% pain relief for the first 7 days after block improvement in range of motion and ability to function.  Thereafter, she has experienced return of her axial low back pain as expected.  We discussed next steps which includes repeating diagnostic lumbar medial branch nerve block #2 after which we can plan for lumbar radiofrequency ablation for the purpose of obtaining long term analgesic benefit.  Risks and benefits of lumbar medial branch nerve block reviewed and patient would like to proceed.  We will plan on doing this with p.o. Valium as we did last time.  Orders:  Orders Placed This Encounter  Procedures   LUMBAR FACET(MEDIAL BRANCH NERVE BLOCK) MBNB    Standing Status:   Future    Standing Expiration Date:   09/04/2021    Scheduling Instructions:     Procedure: Lumbar facet block (AKA.: Lumbosacral medial branch nerve block)     Side: Bilateral     Level: L3-4, L4-5, Facets ( L3, L4, L5, Medial Branch Nerves)     Sedation: PO  Valium     Timeframe: ASAA    Order Specific Question:   Where will this procedure be performed?    Answer:   [facility]   Follow-up plan:   Return in about 2 days (around 08/06/2021) for B/L L3, 4, 5 MBNB #2 , minimal sedation (PO Valium).    Recent Visits Date Type Provider Dept  07/07/21 Procedure visit Phyllis Santa, MD [facility]  06/30/21 Office Visit Phyllis Santa, MD [facility]  Showing recent visits within past 90 days and meeting all other requirements Today's Visits Date Type Provider Dept  08/04/21 Office Visit Phyllis Santa, MD [facility]  Showing today's visits and meeting all other requirements Future Appointments No visits were found meeting these conditions. Showing future appointments within next 90 days and meeting all other requirements  I discussed the assessment and treatment plan with the patient. The patient was provided an opportunity to ask questions and all were answered. The patient agreed with the plan and demonstrated an understanding of the instructions.  Patient advised to call back or seek an in-person evaluation if the symptoms or condition worsens.  Duration of encounter: 33minutes.  Note by: Phyllis Santa, MD Date: 08/04/2021; Time: 11:05 AM

## 2021-08-05 NOTE — Patient Instructions (Signed)
______________________________________________________________________  Preparing for Procedure with Sedation  NOTICE: Due to recent regulatory changes, starting on February 17, 2021, procedures requiring intravenous (IV) sedation will no longer be performed at the [facility].  These types of procedures are required to be performed at [facility] ambulatory surgery facility.  We are very sorry for the inconvenience.  Procedure appointments are limited to planned procedures: No Prescription Refills. No disability issues will be discussed. No medication changes will be discussed.  Instructions: Oral Intake: Do not eat or drink anything for at least 8 hours prior to your procedure. (Exception: Blood Pressure Medication. See below.) Transportation: A driver is required. You may not drive yourself after the procedure. Blood Pressure Medicine: Do not forget to take your blood pressure medicine with a sip of water the morning of the procedure. If your Diastolic (lower reading) is above 100 mmHg, elective cases will be cancelled/rescheduled. Blood thinners: These will need to be stopped for procedures. Notify our staff if you are taking any blood thinners. Depending on which one you take, there will be specific instructions on how and when to stop it. Diabetics on insulin: Notify the staff so that you can be scheduled 1st case in the morning. If your diabetes requires high dose insulin, take only  of your normal insulin dose the morning of the procedure and notify the staff that you have done so. Preventing infections: Shower with an antibacterial soap the morning of your procedure. Build-up your immune system: Take 1000 mg of Vitamin C with every meal (3 times a day) the day prior to your procedure. Antibiotics: Inform the staff if you have a condition or reason that requires you to take antibiotics before dental procedures. Pregnancy: If you are pregnant, call and cancel the procedure. Sickness: If  you have a cold, fever, or any active infections, call and cancel the procedure. Arrival: You must be in the facility at least 30 minutes prior to your scheduled procedure. Children: Do not bring children with you. Dress appropriately: Bring dark clothing that you would not mind if they get stained. Valuables: Do not bring any jewelry or valuables.  Reasons to call and reschedule or cancel your procedure: (Following these recommendations will minimize the risk of a serious complication.) Surgeries: Avoid having procedures within 2 weeks of any surgery. (Avoid for 2 weeks before or after any surgery). Flu Shots: Avoid having procedures within 2 weeks of a flu shots. (Avoid for 2 weeks before or after immunizations). Barium: Avoid having a procedure within 7-10 days after having had a radiological study involving the use of radiological contrast. (Myelograms, Barium swallow or enema study). Heart attacks: Avoid any elective procedures or surgeries for the initial 6 months after a "Myocardial Infarction" (Heart Attack). Blood thinners: It is imperative that you stop these medications before procedures. Let us know if you if you take any blood thinner.  Infection: Avoid procedures during or within two weeks of an infection (including chest colds or gastrointestinal problems). Symptoms associated with infections include: Localized redness, fever, chills, night sweats or profuse sweating, burning sensation when voiding, cough, congestion, stuffiness, runny nose, sore throat, diarrhea, nausea, vomiting, cold or Flu symptoms, recent or current infections. It is specially important if the infection is over the area that we intend to treat. Heart and lung problems: Symptoms that may suggest an active cardiopulmonary problem include: cough, chest pain, breathing difficulties or shortness of breath, dizziness, ankle swelling, uncontrolled high or unusually low blood pressure, and/or palpitations. If you are    experiencing any of these symptoms, cancel your procedure and contact your primary care physician for an evaluation.  Remember:  Regular Business hours are:  Monday to Thursday 8:00 AM to 4:00 PM  Provider's Schedule: Francisco Naveira, MD:  Procedure days: Tuesday and Thursday 7:30 AM to 4:00 PM  Bilal Lateef, MD:  Procedure days: Monday and Wednesday 7:30 AM to 4:00 PM ______________________________________________________________________   

## 2021-08-06 NOTE — Discharge Summary (Signed)
Physician Discharge Summary  Patient ID: Phyllis Smith MRN: [id number] DOB/AGE: 1970/09/11 51 y.o.  Admit date: 07/30/2021 Discharge date: 07/31/2021  Admission Diagnoses:abnormal uterine bleeding   Discharge Diagnoses:  Principal Problem:   Abnormal uterine bleeding   Discharged Condition: stable  Hospital Course: Pt was admitted for observation after undergoing a robotic assisted laparoscopic hysterectomy with bilateral salpingectomy and left oophorectomy. She did well postoperatively with return of bladder function. Tolerating po and ambulating without difficutly. Her pain is well controlled   Consults: None  Significant Diagnostic Studies: labs: hgb pod#1 12.4  Treatments: surgery: robotic assisted laparoscopic hysterectomy with bilateral salpingectomy and left oophorectomy   Discharge Exam: Blood pressure 102/61, pulse 66, temperature 99 F (37.2 C), resp. rate 16, height 5' (1.524 m), weight 68.4 kg, SpO2 98 %. General appearance: alert, cooperative, and no distress Resp: no respiratory distress  GI: soft appropriately tender nondistended  Extremities: extremities normal, atraumatic, no cyanosis or edema Incision/Wound: well approximated no erythema or exudate   Disposition: Discharge disposition: 01-Home or Self Care       Discharge Instructions     Call MD for:  persistant nausea and vomiting   Complete by: As directed    Call MD for:  redness, tenderness, or signs of infection (pain, swelling, redness, odor or green/yellow discharge around incision site)   Complete by: As directed    Call MD for:  severe uncontrolled pain   Complete by: As directed    Call MD for:  temperature >100.4   Complete by: As directed    Diet general   Complete by: As directed    Driving Restrictions   Complete by: As directed    Aviod driving for 1 week   Increase activity slowly   Complete by: As directed    Lifting restrictions   Complete by: As directed    Avoid lifting  over 10 lbs   May shower / Bathe   Complete by: As directed    May walk up steps   Complete by: As directed    No dressing needed   Complete by: As directed    Sexual Activity Restrictions   Complete by: As directed    Avoid sex for 6 weeks      Allergies as of 07/31/2021   No Known Allergies      Medication List     TAKE these medications    acetaminophen 500 MG tablet Commonly known as: TYLENOL Take 2 tablets (1,000 mg total) by mouth every 8 (eight) hours as needed. What changed: when to take this   ALIGN PO Take 2 each by mouth daily.   ibuprofen 600 MG tablet Commonly known as: ADVIL Take 1 tablet (600 mg total) by mouth every 6 (six) hours as needed.   oxyCODONE 5 MG immediate release tablet Commonly known as: Oxy IR/ROXICODONE Take 1-2 tablets (5-10 mg total) by mouth every 4 (four) hours as needed for moderate pain.               Discharge Care Instructions  (From admission, onward)           Start     Ordered   07/30/21 0000  No dressing needed        07/30/21 1608            Follow-up Information     Christophe Louis, MD. Go in 2 week(s).   Specialty: Obstetrics and Gynecology Contact information: [address] [place] [address]  [phone]                 Signed: Christophe Louis 08/06/2021, 11:11 AM

## 2021-08-20 ENCOUNTER — Ambulatory Visit: Payer: BC Managed Care – PPO | Admitting: Family Medicine

## 2021-09-02 ENCOUNTER — Ambulatory Visit
Admission: RE | Admit: 2021-09-02 | Discharge: 2021-09-02 | Disposition: A | Discharge status: Home / Self Care | Payer: BC Managed Care – PPO | Source: Ambulatory Visit | Attending: Student in an Organized Health Care Education/Training Program | Admitting: Student in an Organized Health Care Education/Training Program

## 2021-09-02 ENCOUNTER — Ambulatory Visit (HOSPITAL_BASED_OUTPATIENT_CLINIC_OR_DEPARTMENT_OTHER): Payer: BC Managed Care – PPO | Admitting: Student in an Organized Health Care Education/Training Program

## 2021-09-02 ENCOUNTER — Other Ambulatory Visit: Payer: Self-pay

## 2021-09-02 ENCOUNTER — Encounter: Payer: Self-pay | Admitting: Student in an Organized Health Care Education/Training Program

## 2021-09-02 ENCOUNTER — Ambulatory Visit: Payer: BC Managed Care – PPO | Admitting: Pain Medicine

## 2021-09-02 VITALS — BP 123/84 | HR 65 | Temp 97.4°F | Resp 18 | Ht 60.0 in | Wt 150.0 lb

## 2021-09-02 DIAGNOSIS — G894 Chronic pain syndrome: Secondary | ICD-10-CM

## 2021-09-02 DIAGNOSIS — M47816 Spondylosis without myelopathy or radiculopathy, lumbar region: Secondary | ICD-10-CM

## 2021-09-02 DIAGNOSIS — M47817 Spondylosis without myelopathy or radiculopathy, lumbosacral region: Secondary | ICD-10-CM

## 2021-09-02 MED ORDER — ROPIVACAINE HCL 2 MG/ML IJ SOLN
9.0000 mL | Freq: Once | INTRAMUSCULAR | Status: AC
  Administered 2021-09-02: 9 mL via PERINEURAL

## 2021-09-02 MED ORDER — DEXAMETHASONE SODIUM PHOSPHATE 10 MG/ML IJ SOLN
10.0000 mg | Freq: Once | INTRAMUSCULAR | Status: AC
  Administered 2021-09-02: 10 mg
  Filled 2021-09-02: qty 1

## 2021-09-02 MED ORDER — DIAZEPAM 5 MG PO TABS
5.0000 mg | ORAL_TABLET | ORAL | Status: AC
  Administered 2021-09-02: 5 mg via ORAL

## 2021-09-02 MED ORDER — LIDOCAINE HCL 2 % IJ SOLN
20.0000 mL | Freq: Once | INTRAMUSCULAR | Status: AC
  Administered 2021-09-02: 400 mg
  Filled 2021-09-02: qty 20

## 2021-09-02 MED ORDER — DIAZEPAM 5 MG PO TABS
ORAL_TABLET | ORAL | Status: AC
  Filled 2021-09-02: qty 1

## 2021-09-02 MED ORDER — IOHEXOL 180 MG/ML  SOLN
10.0000 mL | Freq: Once | INTRAMUSCULAR | Status: AC
  Administered 2021-09-02: 5 mL via EPIDURAL
  Filled 2021-09-02: qty 20

## 2021-09-02 NOTE — Patient Instructions (Signed)

## 2021-09-02 NOTE — Progress Notes (Signed)
Safety precautions to be maintained throughout the outpatient stay will include: orient to surroundings, keep bed in low position, maintain call bell within reach at all times, provide assistance with transfer out of bed and ambulation.  

## 2021-09-02 NOTE — Progress Notes (Signed)
PROVIDER NOTE: Information contained herein reflects review and annotations entered in association with encounter. Interpretation of such information and data should be left to medically-trained personnel. Information provided to patient can be located elsewhere in the medical record under "Patient Instructions". Document created using STT-dictation technology, any transcriptional errors that may result from process are unintentional.    Patient: Phyllis Smith  Service Category: Procedure Provider: Gillis Santa, MD DOB: 1971/07/17 DOS: 09/02/2021 Location: [facility] MRN: [id number] Setting: Ambulatory - outpatient Referring Provider: [name], MD Type: Established Patient Specialty: Interventional Pain Management PCP: [name], MD  Primary Reason for Visit: Interventional Pain Management Treatment. CC: Back Pain (Lumbar bilateral )     Procedure:          Type: Lumbar Facet, Medial Branch Block(s) #2  Primary Purpose: Diagnostic/Therapeutic Region: Posterolateral Lumbosacral Spine Level: L3, L4, L5,Medial Branch Level(s). Injecting these levels blocks the L3-4, L4-5, lumbar facet joints. Laterality: Bilateral Anesthesia: Local (1-2% Lidocaine)  Anxiolysis: Oral 5 mg PO Valium  Sedation: None  Guidance: Fluoroscopy          Indications: 1. Spondylosis of lumbosacral region without myelopathy or radiculopathy   2. Lumbar facet arthropathy   3. Lumbar spondylosis   4. Chronic pain syndrome     Pain Score: Pre-procedure: 4 /10 Post-procedure: 0-No pain/10     Position: Prone  Pre-op H&P Assessment:  Phyllis Smith is a 51 y.o. (year old), female patient, seen today for interventional treatment. She  has a past surgical history that includes Cesarean section (2001); Appendectomy (1994); Cesarean section (2007); Ankle fracture surgery (Right, 2014); Endometrial biopsy (05/15/2021); Robotic assisted laparoscopic hysterectomy and salpingectomy (N/A, 07/30/2021);  and Abdominal hysterectomy. Phyllis Smith has a current medication list which includes the following prescription(s): ibuprofen, probiotic product, acetaminophen, and oxycodone. Her primarily concern today is the Back Pain (Lumbar bilateral )  Initial Vital Signs:  Pulse/HCG Rate: 73  Temp:  (!) 97.4 F (36.3 C) Resp: 16 BP: 112/74 SpO2: 100 %  BMI: Estimated body mass index is 29.29 kg/m as calculated from the following:   Height as of this encounter: 5' (1.524 m).   Weight as of this encounter: 150 lb (68 kg).  Risk Assessment: Allergies: Reviewed. She has No Known Allergies.  Allergy Precautions: None required Coagulopathies: Reviewed. None identified.  Blood-thinner therapy: None at this time Active Infection(s): Reviewed. None identified. Phyllis Smith is afebrile  Site Confirmation: Phyllis Smith was asked to confirm the procedure and laterality before marking the site Procedure checklist: Completed Consent: Before the procedure and under the influence of no sedative(s), amnesic(s), or anxiolytics, the patient was informed of the treatment options, risks and possible complications. To fulfill our ethical and legal obligations, as recommended by the American Medical Association's Code of Ethics, I have informed the patient of my clinical impression; the nature and purpose of the treatment or procedure; the risks, benefits, and possible complications of the intervention; the alternatives, including doing nothing; the risk(s) and benefit(s) of the alternative treatment(s) or procedure(s); and the risk(s) and benefit(s) of doing nothing. The patient was provided information about the general risks and possible complications associated with the procedure. These may include, but are not limited to: failure to achieve desired goals, infection, bleeding, organ or nerve damage, allergic reactions, paralysis, and death. In addition, the patient was informed of those risks and complications associated to  Spine-related procedures, such as failure to decrease pain; infection (i.e.: Meningitis, epidural or intraspinal abscess); bleeding (i.e.: epidural  hematoma, subarachnoid hemorrhage, or any other type of intraspinal or peri-dural bleeding); organ or nerve damage (i.e.: Any type of peripheral nerve, nerve root, or spinal cord injury) with subsequent damage to sensory, motor, and/or autonomic systems, resulting in permanent pain, numbness, and/or weakness of one or several areas of the body; allergic reactions; (i.e.: anaphylactic reaction); and/or death. Furthermore, the patient was informed of those risks and complications associated with the medications. These include, but are not limited to: allergic reactions (i.e.: anaphylactic or anaphylactoid reaction(s)); adrenal axis suppression; blood sugar elevation that in diabetics may result in ketoacidosis or comma; water retention that in patients with history of congestive heart failure may result in shortness of breath, pulmonary edema, and decompensation with resultant heart failure; weight gain; swelling or edema; medication-induced neural toxicity; particulate matter embolism and blood vessel occlusion with resultant organ, and/or nervous system infarction; and/or aseptic necrosis of one or more joints. Finally, the patient was informed that Medicine is not an exact science; therefore, there is also the possibility of unforeseen or unpredictable risks and/or possible complications that may result in a catastrophic outcome. The patient indicated having understood very clearly. We have given the patient no guarantees and we have made no promises. Enough time was given to the patient to ask questions, all of which were answered to the patient's satisfaction. Phyllis Smith has indicated that she wanted to continue with the procedure. Attestation: I, the ordering provider, attest that I have discussed with the patient the benefits, risks, side-effects, alternatives,  likelihood of achieving goals, and potential problems during recovery for the procedure that I have provided informed consent. Date   Time: 09/02/2021 11:01 AM  Pre-Procedure Preparation:  Monitoring: As per clinic protocol. Respiration, ETCO2, SpO2, BP, heart rate and rhythm monitor placed and checked for adequate function Safety Precautions: Patient was assessed for positional comfort and pressure points before starting the procedure. Time-out: I initiated and conducted the "Time-out" before starting the procedure, as per protocol. The patient was asked to participate by confirming the accuracy of the "Time Out" information. Verification of the correct person, site, and procedure were performed and confirmed by me, the nursing staff, and the patient. "Time-out" conducted as per Joint Commission's Universal Protocol (UP.01.01.01). Time: 1140  Description of Procedure:          Laterality: Bilateral. The procedure was performed in identical fashion on both sides. Levels:   L3, L4, L5,  Medial Branch Level(s) Area Prepped: Posterior Lumbosacral Region DuraPrep (Iodine Povacrylex [0.7% available iodine] and Isopropyl Alcohol, 74% w/w) Safety Precautions: Aspiration looking for blood return was conducted prior to all injections. At no point did we inject any substances, as a needle was being advanced. Before injecting, the patient was told to immediately notify me if she was experiencing any new onset of "ringing in the ears, or metallic taste in the mouth". No attempts were made at seeking any paresthesias. Safe injection practices and needle disposal techniques used. Medications properly checked for expiration dates. SDV (single dose vial) medications used. After the completion of the procedure, all disposable equipment used was discarded in the proper designated medical waste containers. Local Anesthesia: Protocol guidelines were followed. The patient was positioned over the fluoroscopy table. The area  was prepped in the usual manner. The time-out was completed. The target area was identified using fluoroscopy. A 12-in long, straight, sterile hemostat was used with fluoroscopic guidance to locate the targets for each level blocked. Once located, the skin was marked with an approved surgical  skin marker. Once all sites were marked, the skin (epidermis, dermis, and hypodermis), as well as deeper tissues (fat, connective tissue and muscle) were infiltrated with a small amount of a short-acting local anesthetic, loaded on a 10cc syringe with a 25G, 1.5-in  Needle. An appropriate amount of time was allowed for local anesthetics to take effect before proceeding to the next step. Local Anesthetic: Lidocaine 2.0% The unused portion of the local anesthetic was discarded in the proper designated containers. Technical explanation of process:  L3 Medial Branch Nerve Block (MBB): The target area for the L3 medial branch is at the junction of the postero-lateral aspect of the superior articular process and the superior, posterior, and medial edge of the transverse process of L4. Under fluoroscopic guidance, a Quincke needle was inserted until contact was made with os over the superior postero-lateral aspect of the pedicular shadow (target area). After negative aspiration for blood, 36mL of the nerve block solution was injected without difficulty or complication. The needle was removed intact. L4 Medial Branch Nerve Block (MBB): The target area for the L4 medial branch is at the junction of the postero-lateral aspect of the superior articular process and the superior, posterior, and medial edge of the transverse process of L5. Under fluoroscopic guidance, a Quincke needle was inserted until contact was made with os over the superior postero-lateral aspect of the pedicular shadow (target area). After negative aspiration for blood, 29mL of the nerve block solution was injected without difficulty or complication. The needle was  removed intact. L5 Medial Branch Nerve Block (MBB): The target area for the L5 medial branch is at the junction of the postero-lateral aspect of the superior articular process and the superior, posterior, and medial edge of the sacral ala. Under fluoroscopic guidance, a Quincke needle was inserted until contact was made with os over the superior postero-lateral aspect of the pedicular shadow (target area). After negative aspiration for blood, 83mL of the nerve block solution was injected without difficulty or complication. The needle was removed intact.  Nerve block solution: 12 cc solution made of 10 cc of 0.2% ropivacaine, 2 cc of Decadron 10 mg/cc.  2 cc injected at each level above bilaterally.   Procedural Needles: 22-gauge, 3.5-inch, Quincke needles used for all levels.  Once the entire procedure was completed, the treated area was cleaned, making sure to leave some of the prepping solution back to take advantage of its long term bactericidal properties.      Illustration of the posterior view of the lumbar spine and the posterior neural structures. Laminae of L2 through S1 are labeled. DPRL5, dorsal primary ramus of L5; DPRS1, dorsal primary ramus of S1; DPR3, dorsal primary ramus of L3; FJ, facet (zygapophyseal) joint L3-L4; I, inferior articular process of L4; LB1, lateral branch of dorsal primary ramus of L1; IAB, inferior articular branches from L3 medial branch (supplies L4-L5 facet joint); IBP, intermediate branch plexus; MB3, medial branch of dorsal primary ramus of L3; NR3, third lumbar nerve root; S, superior articular process of L5; SAB, superior articular branches from L4 (supplies L4-5 facet joint also); TP3, transverse process of L3.  Vitals:   09/02/21 1105 09/02/21 1135 09/02/21 1145 09/02/21 1154  BP: 112/74 109/84 121/85 123/84  Pulse: 73 77 66 65  Resp: 16 18 20 18   Temp: (!) 97.4 F (36.3 C)     TempSrc: Temporal     SpO2: 100% 98% 97% 98%  Weight: 150 lb (68 kg)      Height: 5' (  1.524 m)         Start Time: 1140 hrs. End Time: 1152 hrs.  Imaging Guidance (Spinal):          Type of Imaging Technique: Fluoroscopy Guidance (Spinal) Indication(s): Assistance in needle guidance and placement for procedures requiring needle placement in or near specific anatomical locations not easily accessible without such assistance. Exposure Time: Please see nurses notes. Contrast: None used. Fluoroscopic Guidance: I was personally present during the use of fluoroscopy. "Tunnel Vision Technique" used to obtain the best possible view of the target area. Parallax error corrected before commencing the procedure. "Direction-depth-direction" technique used to introduce the needle under continuous pulsed fluoroscopy. Once target was reached, antero-posterior, oblique, and lateral fluoroscopic projection used confirm needle placement in all planes. Images permanently stored in EMR. Interpretation: No contrast injected. I personally interpreted the imaging intraoperatively. Adequate needle placement confirmed in multiple planes. Permanent images saved into the patient's record.   Post-operative Assessment:  Post-procedure Vital Signs:  Pulse/HCG Rate: 65 (nsr)  Temp:  (!) 97.4 F (36.3 C) Resp: 18 BP: 123/84 SpO2: 98 %  EBL: None  Complications: No immediate post-treatment complications observed by team, or reported by patient.  Note: The patient tolerated the entire procedure well. A repeat set of vitals were taken after the procedure and the patient was kept under observation following institutional policy, for this type of procedure. Post-procedural neurological assessment was performed, showing return to baseline, prior to discharge. The patient was provided with post-procedure discharge instructions, including a section on how to identify potential problems. Should any problems arise concerning this procedure, the patient was given instructions to immediately contact  us, at any time, without hesitation. In any case, we plan to contact the patient by telephone for a follow-up status report regarding this interventional procedure.  Comments:  No additional relevant information.  5 out of 5 strength bilateral lower extremity: Plantar flexion, dorsiflexion, knee flexion, knee extension.   Plan of Care  Orders:  Orders Placed This Encounter  Procedures   DG [facility] C-ARM 1-60 MIN NO REPORT    Intraoperative interpretation by procedural physician at [facility].    Standing Status:   Standing    Number of Occurrences:   1    Order Specific Question:   Reason for exam:    Answer:   Assistance in needle guidance and placement for procedures requiring needle placement in or near specific anatomical locations not easily accessible without such assistance.     Medications ordered for procedure: Meds ordered this encounter  Medications   iohexol (OMNIPAQUE) 180 MG/ML injection 10 mL    Must be Myelogram-compatible. If not available, you may substitute with a water-soluble, non-ionic, hypoallergenic, myelogram-compatible radiological contrast medium.   lidocaine (XYLOCAINE) 2 % (with pres) injection 400 mg   diazepam (VALIUM) tablet 5 mg    Make sure Flumazenil is available in the pyxis when using this medication. If oversedation occurs, administer 0.2 mg IV over 15 sec. If after 45 sec no response, administer 0.2 mg again over 1 min; may repeat at 1 min intervals; not to exceed 4 doses (1 mg)   dexamethasone (DECADRON) injection 10 mg   dexamethasone (DECADRON) injection 10 mg   ropivacaine (PF) 2 mg/mL (0.2%) (NAROPIN) injection 9 mL   Medications administered: We administered iohexol, lidocaine, diazepam, dexamethasone, dexamethasone, and ropivacaine (PF) 2 mg/mL (0.2%).  See the medical record for exact dosing, route, and time of administration.  Follow-up plan:   Return in  about 4 weeks (around 09/30/2021) for Post Procedure Evaluation,  virtual.     Recent Visits Date Type Provider Dept  08/04/21 Office Visit Gillis Santa, MD [facility]  07/07/21 Procedure visit Gillis Santa, MD [facility]  06/30/21 Office Visit Gillis Santa, MD [facility]  Showing recent visits within past 90 days and meeting all other requirements Today's Visits Date Type Provider Dept  09/02/21 Procedure visit Gillis Santa, MD [facility]  Showing today's visits and meeting all other requirements Future Appointments Date Type Provider Dept  09/30/21 Appointment Gillis Santa, MD [facility]  Showing future appointments within next 90 days and meeting all other requirements  Disposition: Discharge home  Discharge (Date   Time): 09/02/2021; 1157 hrs.   Primary Care Physician: [name], MD Location: [facility] Note by: Gillis Santa, MD Date: 09/02/2021; Time: 1:53 PM  Disclaimer:  Medicine is not an exact science. The only guarantee in medicine is that nothing is guaranteed. It is important to note that the decision to proceed with this intervention was based on the information collected from the patient. The Data and conclusions were drawn from the patient's questionnaire, the interview, and the physical examination. Because the information was provided in large part by the patient, it cannot be guaranteed that it has not been purposely or unconsciously manipulated. Every effort has been made to obtain as much relevant data as possible for this evaluation. It is important to note that the conclusions that lead to this procedure are derived in large part from the available data. Always take into account that the treatment will also be dependent on availability of resources and existing treatment guidelines, considered by other Pain Management Practitioners as being common knowledge and practice, at the time of the intervention. For Medico-Legal purposes, it is  also important to point out that variation in procedural techniques and pharmacological choices are the acceptable norm. The indications, contraindications, technique, and results of the above procedure should only be interpreted and judged by a Board-Certified Interventional Pain Specialist with extensive familiarity and expertise in the same exact procedure and technique.

## 2021-09-15 DIAGNOSIS — R232 Flushing: Secondary | ICD-10-CM | POA: Diagnosis not present

## 2021-09-22 ENCOUNTER — Other Ambulatory Visit: Payer: Self-pay | Admitting: Obstetrics and Gynecology

## 2021-09-22 DIAGNOSIS — Z1231 Encounter for screening mammogram for malignant neoplasm of breast: Secondary | ICD-10-CM

## 2021-09-29 ENCOUNTER — Encounter: Payer: Self-pay | Admitting: Student in an Organized Health Care Education/Training Program

## 2021-09-30 ENCOUNTER — Ambulatory Visit
Payer: BC Managed Care – PPO | Attending: Student in an Organized Health Care Education/Training Program | Admitting: Student in an Organized Health Care Education/Training Program

## 2021-09-30 ENCOUNTER — Ambulatory Visit
Admission: RE | Admit: 2021-09-30 | Discharge: 2021-09-30 | Disposition: A | Discharge status: Home / Self Care | Payer: BC Managed Care – PPO | Source: Ambulatory Visit | Attending: Obstetrics and Gynecology | Admitting: Obstetrics and Gynecology

## 2021-09-30 ENCOUNTER — Other Ambulatory Visit: Payer: Self-pay

## 2021-09-30 ENCOUNTER — Encounter: Payer: Self-pay | Admitting: Student in an Organized Health Care Education/Training Program

## 2021-09-30 DIAGNOSIS — M47816 Spondylosis without myelopathy or radiculopathy, lumbar region: Secondary | ICD-10-CM | POA: Diagnosis not present

## 2021-09-30 DIAGNOSIS — M47817 Spondylosis without myelopathy or radiculopathy, lumbosacral region: Secondary | ICD-10-CM

## 2021-09-30 DIAGNOSIS — Z1231 Encounter for screening mammogram for malignant neoplasm of breast: Secondary | ICD-10-CM | POA: Diagnosis not present

## 2021-09-30 DIAGNOSIS — G894 Chronic pain syndrome: Secondary | ICD-10-CM | POA: Diagnosis not present

## 2021-09-30 NOTE — Progress Notes (Signed)
Patient: Phyllis Smith  Service Category: E/M  Provider: Gillis Santa, MD  ?DOB: 03-30-1971  DOS: 09/30/2021  Location: Office  ?MRN: [id number]  Setting: Ambulatory outpatient  Referring Provider: [name], MD  ?Type: Established Patient  Specialty: Interventional Pain Management  PCP: [name], MD  ?Location: Remote location  Delivery: TeleHealth    ? ?Virtual Encounter - Pain Management ?PROVIDER NOTE: Information contained herein reflects review and annotations entered in association with encounter. Interpretation of such information and data should be left to medically-trained personnel. Information provided to patient can be located elsewhere in the medical record under "Patient Instructions". Document created using STT-dictation technology, any transcriptional errors that may result from process are unintentional.  ?  ?Contact & Pharmacy ?Preferred: [phone] ?Home: [phone] (home) ?Mobile: [phone] (mobile) ?E-mail: [email or web address]  ?[facility] [place], [address] ?[place] [address] ?Phone: [phone] Fax: [phone] ? ?[id number] - [place], [address] ?[place] ?[place] [address] ?Phone: [phone] Fax: [phone] ?  ?Pre-screening  ?Ms. Pedley offered "in-person" vs "virtual" encounter. She indicated preferring virtual for this encounter.  ? ?Reason ?COVID-19*  Social distancing based on CDC and AMA recommendations.  ? ?I contacted Karmina Laabs on 09/30/2021 via telephone.      I clearly identified myself as Gillis Santa, MD. I verified that I was speaking with the correct person using two identifiers (Name: [name], and date of birth: 49-Jan-1972). ? ?Consent ?I sought verbal advanced consent from Via Alcott for virtual visit interactions. I informed [name] of possible security and privacy concerns,  risks, and limitations associated with providing "not-in-person" medical evaluation and management services. I also informed [name] of the availability of "in-person" appointments. Finally, I informed her that there would be a charge for the virtual visit and that she could be  personally, fully or partially, financially responsible for it. [name] expressed understanding and agreed to proceed.  ? ?Historic Elements   ?Ms. Phyllis Smith is a 51 y.o. year old, female patient evaluated today after our last contact on 09/02/2021. [name]  has a past medical history of Abnormal uterine bleeding (AUB), Broken ankle, right, closed, initial encounter (2014), History of COVID-19 (01/2020), and Wears glasses. She also  has a past surgical history that includes Cesarean section (2001); Appendectomy (1994); Cesarean section (2007); Ankle fracture surgery (Right, 2014); Endometrial biopsy (05/15/2021); Robotic assisted laparoscopic hysterectomy and salpingectomy (N/A, 07/30/2021); and Abdominal hysterectomy. [name] has a current medication list which includes the following prescription(s): ibuprofen, probiotic product, acetaminophen, and oxycodone. She  reports that she quit smoking about 11 years ago. Her smoking use included cigarettes and e-cigarettes. She has a 25.00 pack-year smoking history. She has never used smokeless tobacco. She reports current alcohol use of about 8.0 standard drinks per week. She reports current drug use. Drug: Marijuana. Ms. Booz has No Known Allergies.  ? ?HPI  ?Today, she is being contacted for a post-procedure assessment. ? ? ?Post-procedure evaluation  ?   ?Procedure:          ?Type: Lumbar Facet, Medial Branch Block(s) #2  ?Primary Purpose: Diagnostic/Therapeutic ?Region: Posterolateral Lumbosacral Spine ?Level: L3, L4, L5,Medial Branch Level(s). Injecting these levels blocks the L3-4, L4-5, lumbar facet joints. ?Laterality: Bilateral ?Anesthesia: Local (1-2% Lidocaine)  ?Anxiolysis: Oral 5 mg PO  Valium  ?Sedation: None  ?  Guidance: Fluoroscopy         ? ?Indications: ?1. Spondylosis of lumbosacral region without myelopathy or radiculopathy   ?2. Lumbar facet arthropathy   ?3. Lumbar spondylosis   ?4. Chronic pain syndrome   ? ? ?Pain Score: ?Pre-procedure: 4 /10 ?Post-procedure: 0-No pain/10  ? ?   ?Effectiveness:  ?Initial hour after procedure: 100 %  ?Subsequent 4-6 hours post-procedure: 100 %  ?Analgesia past initial 6 hours: 100% for the first 3-4 days then reduced to 40 % (1 week)  ?Ongoing improvement:  ?Analgesic:  <25% ?Function: Somewhat improved ?ROM: Back to baseline ? ? ?Assessment  ?The primary encounter diagnosis was Spondylosis of lumbosacral region without myelopathy or radiculopathy. Diagnoses of Lumbar facet arthropathy, Lumbar spondylosis, and Chronic pain syndrome were also pertinent to this visit. ? ?Plan of Care  ?[name] is status post 2 positive diagnostic lumbar facet medial branch nerve blocks performed bilaterally at L3, L4, L5 on 07/07/2021, 09/02/2021.  Both of these blocks provided her with approximately 80% pain relief for the first week and improvement in her range of motion with gradual return of pain thereafter.  Given persistent pain that has been responsive to diagnostic medial branch nerve blocks, we discussed lumbar radiofrequency ablation for the purpose of obtaining longer-term pain relief.  Risk and benefits were discussed in great detail.  We will start with the right side first followed 2 weeks later by the left side.  We will plan on doing this with IV sedation. ? ? ?Orders:  ?Orders Placed This Encounter  ?Procedures  ? Radiofrequency,Lumbar  ?  Standing Status:   Future  ?  Standing Expiration Date:   04/02/2022  ?  Scheduling Instructions:  ?   Side(s): Right  ?   Level(s): L3, L4, L5, Medial Branch Nerve(s)  ?   Sedation: With Sedation  ?   Scheduling Timeframe: As soon as pre-approved  ?  Order Specific Question:   Where will this procedure be performed?  ?   Answer:   [facility]  ? Radiofrequency,Lumbar  ?  Standing Status:   Future  ?  Standing Expiration Date:   04/02/2022  ?  Scheduling Instructions:  ?   Side(s): Left  ?   Level(s): L3, L4, L5,  Medial Branch Nerve(s)  ?   Sedation: With Sedation  ?   Scheduling Timeframe: 2 weeks after right  ?  Order Specific Question:   Where will this procedure be performed?  ?  Answer:   [facility]  ? ?Follow-up plan:   ?Return in about 1 week (around 10/07/2021) for R L3, 4, 5 RFA, ECT.   ? ?Recent Visits ?Date Type Provider Dept  ?09/02/21 Procedure visit Gillis Santa, MD [facility]  ?08/04/21 Office Visit Gillis Santa, MD [facility]  ?07/07/21 Procedure visit Gillis Santa, MD [facility]  ?Showing recent visits within past 90 days and meeting all other requirements ?Today's Visits ?Date Type Provider Dept  ?09/30/21 Office Visit Gillis Santa, MD [facility]  ?Showing today's visits and meeting all other requirements ?Future Appointments ?No visits were found meeting these conditions. ?Showing future appointments within next 90 days and meeting all other requirements ? ?I discussed the assessment and treatment plan with the patient. The patient was provided an opportunity to ask questions and all were answered. The patient agreed with the plan and demonstrated an understanding of the instructions. ? ?Patient advised to call back or seek an in-person evaluation if the  symptoms or condition worsens. ? ?Duration of encounter: 5mnutes. ? ?Note by: BGillis Santa MD ?Date: 09/30/2021; Time: 1:30 PM ?

## 2021-10-13 ENCOUNTER — Ambulatory Visit: Payer: BC Managed Care – PPO | Admitting: Student in an Organized Health Care Education/Training Program

## 2021-10-15 ENCOUNTER — Encounter: Payer: Self-pay | Admitting: Student in an Organized Health Care Education/Training Program

## 2021-10-15 ENCOUNTER — Ambulatory Visit
Admission: RE | Admit: 2021-10-15 | Discharge: 2021-10-15 | Disposition: A | Discharge status: Home / Self Care | Payer: BC Managed Care – PPO | Source: Ambulatory Visit | Attending: Student in an Organized Health Care Education/Training Program | Admitting: Student in an Organized Health Care Education/Training Program

## 2021-10-15 ENCOUNTER — Other Ambulatory Visit: Payer: Self-pay

## 2021-10-15 ENCOUNTER — Ambulatory Visit: Payer: BC Managed Care – PPO | Admitting: Student in an Organized Health Care Education/Training Program

## 2021-10-15 VITALS — BP 107/65 | Temp 97.4°F | Resp 16 | Ht 60.0 in | Wt 150.0 lb

## 2021-10-15 DIAGNOSIS — G894 Chronic pain syndrome: Secondary | ICD-10-CM

## 2021-10-15 DIAGNOSIS — M47817 Spondylosis without myelopathy or radiculopathy, lumbosacral region: Secondary | ICD-10-CM | POA: Insufficient documentation

## 2021-10-15 DIAGNOSIS — M47816 Spondylosis without myelopathy or radiculopathy, lumbar region: Secondary | ICD-10-CM | POA: Insufficient documentation

## 2021-10-15 MED ORDER — MIDAZOLAM HCL 5 MG/5ML IJ SOLN
0.5000 mg | Freq: Once | INTRAMUSCULAR | Status: AC
  Administered 2021-10-15: 1 mg via INTRAVENOUS

## 2021-10-15 MED ORDER — LIDOCAINE HCL (PF) 2 % IJ SOLN
INTRAMUSCULAR | Status: AC
  Filled 2021-10-15: qty 5

## 2021-10-15 MED ORDER — MIDAZOLAM HCL 5 MG/5ML IJ SOLN
INTRAMUSCULAR | Status: AC
  Filled 2021-10-15: qty 5

## 2021-10-15 MED ORDER — LIDOCAINE HCL 2 % IJ SOLN
20.0000 mL | Freq: Once | INTRAMUSCULAR | Status: AC
  Administered 2021-10-15: 100 mg

## 2021-10-15 MED ORDER — ROPIVACAINE HCL 2 MG/ML IJ SOLN
INTRAMUSCULAR | Status: AC
  Filled 2021-10-15: qty 20

## 2021-10-15 MED ORDER — DEXAMETHASONE SODIUM PHOSPHATE 10 MG/ML IJ SOLN
INTRAMUSCULAR | Status: AC
  Filled 2021-10-15: qty 1

## 2021-10-15 MED ORDER — FENTANYL CITRATE (PF) 100 MCG/2ML IJ SOLN
25.0000 ug | INTRAMUSCULAR | Status: DC | PRN
  Administered 2021-10-15: 75 ug via INTRAVENOUS

## 2021-10-15 MED ORDER — ROPIVACAINE HCL 2 MG/ML IJ SOLN
9.0000 mL | Freq: Once | INTRAMUSCULAR | Status: AC
  Administered 2021-10-15: 9 mL via PERINEURAL

## 2021-10-15 MED ORDER — FENTANYL CITRATE (PF) 100 MCG/2ML IJ SOLN
INTRAMUSCULAR | Status: AC
  Filled 2021-10-15: qty 2

## 2021-10-15 MED ORDER — DEXAMETHASONE SODIUM PHOSPHATE 10 MG/ML IJ SOLN
10.0000 mg | Freq: Once | INTRAMUSCULAR | Status: AC
  Administered 2021-10-15: 10 mg

## 2021-10-15 NOTE — Patient Instructions (Signed)

## 2021-10-15 NOTE — Progress Notes (Signed)
PROVIDER NOTE: Interpretation of information contained herein should be left to medically-trained personnel. Specific patient instructions are provided elsewhere under "Patient Instructions" section of medical record. This document was created in part using STT-dictation technology, any transcriptional errors that may result from this process are unintentional.  ?Patient: Phyllis Smith ?Type: Established ?DOB: 1970-08-17 ?MRN: [id number] ?PCP: [name], MD  Service: Procedure ?DOS: 10/15/2021 ?Setting: Ambulatory ?Location: Ambulatory outpatient facility ?Delivery: Face-to-face Provider: Gillis Santa, MD ?Specialty: Interventional Pain Management ?Specialty designation: 09 ?Location: Outpatient facility ?Ref. Prov.: [name], MD   ? ?Primary Reason for Visit: Interventional Pain Management Treatment. ?CC: Back Pain (Right, lower) ? ?Procedure:          ? Type: Lumbar Facet, Medial Branch Radiofrequency Ablation (RFA) #1  ?Laterality: Right  ?Level: L3, L4, L5, Medial Branch Level(s). These levels will denervate the L3-4 and L5-S1 lumbar facet joints.  ?Imaging: Fluoroscopic guidance ?Anesthesia: Local anesthesia (1-2% Lidocaine) ? ?Sedation: Moderate conscious sedation. ?DOS: 10/15/2021  ?Performed by: Gillis Santa, MD ? ?Purpose: Therapeutic/Palliative ?Indications: Low back pain severe enough to impact quality of life or function. ?Indications: ?1. Spondylosis of lumbosacral region without myelopathy or radiculopathy   ?2. Lumbar facet arthropathy   ?3. Lumbar spondylosis   ?4. Chronic pain syndrome   ? ?Phyllis Smith has been dealing with the above chronic pain for longer than three months and has either failed to respond, was unable to tolerate, or simply did not get enough benefit from other more conservative therapies including, but not limited to: ?1. Over-the-counter medications ?2. Anti-inflammatory medications ?3. Muscle relaxants ?4. Membrane stabilizers ?5. Opioids ?6. Physical therapy and/or  chiropractic manipulation ?7. Modalities (Heat, ice, etc.) ?8. Invasive techniques such as nerve blocks. ?Phyllis Smith has attained more than 50% relief of the pain from a series of diagnostic injections conducted in separate occasions. ? ?Pain Score: ?Pre-procedure: 6 /10 ?Post-procedure: 0-No pain/10 ? ?  ? ?Position / Prep / Materials:  ?Position: Prone  ?Prep solution: DuraPrep (Iodine Povacrylex [0.7% available iodine] and Isopropyl Alcohol, 74% w/w) ?Prep Area: Entire Lumbosacral Region (Lower back from mid-thoracic region to end of tailbone and from flank to flank.) ?Materials:  ?Tray: RFA (Radiofrequency) tray ?Needle(s):  ?Type: RFA (Teflon-coated radiofrequency ablation needles) ?Gauge (G): 22  ?Length: Regular (10cm) ?Qty: 3 ? ?Pre-op H&P Assessment:  ?Phyllis Smith is a 51 y.o. (year old), female patient, seen today for interventional treatment. She  has a past surgical history that includes Cesarean section (2001); Appendectomy (1994); Cesarean section (2007); Ankle fracture surgery (Right, 2014); Endometrial biopsy (05/15/2021); Robotic assisted laparoscopic hysterectomy and salpingectomy (N/A, 07/30/2021); and Abdominal hysterectomy. Phyllis Smith has a current medication list which includes the following prescription(s): ibuprofen, probiotic product, acetaminophen, and oxycodone, and the following Facility-Administered Medications: fentanyl. Her primarily concern today is the Back Pain (Right, lower) ? ?Initial Vital Signs:  ?Pulse/HCG Rate:  ECG Heart Rate: 74 ?Temp: (!) 97.4 ?F (36.3 ?C) ?Resp: 16 ?BP: 107/69 ?SpO2: 99 % ? ?BMI: Estimated body mass index is 29.29 kg/m? as calculated from the following: ?  Height as of this encounter: 5' (1.524 m). ?  Weight as of this encounter: 150 lb (68 kg). ? ?Risk Assessment: ?Allergies: Reviewed. She has No Known Allergies.  ?Allergy Precautions: None required ?Coagulopathies: Reviewed. None identified.  ?Blood-thinner therapy: None at this time ?Active Infection(s):  Reviewed. None identified. Phyllis Smith is afebrile ? ?Site Confirmation: Phyllis Smith was asked to confirm the procedure and laterality before marking the site ?Procedure checklist:  Completed ?Consent: Before the procedure and under the influence of no sedative(s), amnesic(s), or anxiolytics, the patient was informed of the treatment options, risks and possible complications. To fulfill our ethical and legal obligations, as recommended by the American Medical Association's Code of Ethics, I have informed the patient of my clinical impression; the nature and purpose of the treatment or procedure; the risks, benefits, and possible complications of the intervention; the alternatives, including doing nothing; the risk(s) and benefit(s) of the alternative treatment(s) or procedure(s); and the risk(s) and benefit(s) of doing nothing. ?The patient was provided information about the general risks and possible complications associated with the procedure. These may include, but are not limited to: failure to achieve desired goals, infection, bleeding, organ or nerve damage, allergic reactions, paralysis, and death. ?In addition, the patient was informed of those risks and complications associated to Spine-related procedures, such as failure to decrease pain; infection (i.e.: Meningitis, epidural or intraspinal abscess); bleeding (i.e.: epidural hematoma, subarachnoid hemorrhage, or any other type of intraspinal or peri-dural bleeding); organ or nerve damage (i.e.: Any type of peripheral nerve, nerve root, or spinal cord injury) with subsequent damage to sensory, motor, and/or autonomic systems, resulting in permanent pain, numbness, and/or weakness of one or several areas of the body; allergic reactions; (i.e.: anaphylactic reaction); and/or death. ?Furthermore, the patient was informed of those risks and complications associated with the medications. These include, but are not limited to: allergic reactions (i.e.: anaphylactic or  anaphylactoid reaction(s)); adrenal axis suppression; blood sugar elevation that in diabetics may result in ketoacidosis or comma; water retention that in patients with history of congestive heart failure may result in shortness of breath, pulmonary edema, and decompensation with resultant heart failure; weight gain; swelling or edema; medication-induced neural toxicity; particulate matter embolism and blood vessel occlusion with resultant organ, and/or nervous system infarction; and/or aseptic necrosis of one or more joints. ?Finally, the patient was informed that Medicine is not an exact science; therefore, there is also the possibility of unforeseen or unpredictable risks and/or possible complications that may result in a catastrophic outcome. The patient indicated having understood very clearly. We have given the patient no guarantees and we have made no promises. Enough time was given to the patient to ask questions, all of which were answered to the patient's satisfaction. [name] has indicated that she wanted to continue with the procedure. ?Attestation: I, the ordering provider, attest that I have discussed with the patient the benefits, risks, side-effects, alternatives, likelihood of achieving goals, and potential problems during recovery for the procedure that I have provided informed consent. ?Date  Time: 10/15/2021  9:03 AM ? ?Pre-Procedure Preparation:  ?Monitoring: As per clinic protocol. Respiration, ETCO2, SpO2, BP, heart rate and rhythm monitor placed and checked for adequate function ?Safety Precautions: Patient was assessed for positional comfort and pressure points before starting the procedure. ?Time-out: I initiated and conducted the "Time-out" before starting the procedure, as per protocol. The patient was asked to participate by confirming the accuracy of the "Time Out" information. Verification of the correct person, site, and procedure were performed and confirmed by me, the nursing staff,  and the patient. "Time-out" conducted as per Joint Commission's Universal Protocol (UP.01.01.01). ?Time: 0830 ? ?Description of Procedure:          ?Laterality: Right ?Levels:  L3, L4, L5, Medial Branch Level(s), a

## 2021-10-16 ENCOUNTER — Telehealth: Payer: Self-pay

## 2021-10-16 NOTE — Telephone Encounter (Signed)
Post procedure phone call.  Patient states she is doing great 

## 2021-10-29 ENCOUNTER — Ambulatory Visit
Admission: RE | Admit: 2021-10-29 | Discharge: 2021-10-29 | Disposition: A | Discharge status: Home / Self Care | Payer: BC Managed Care – PPO | Source: Ambulatory Visit | Attending: Student in an Organized Health Care Education/Training Program | Admitting: Student in an Organized Health Care Education/Training Program

## 2021-10-29 ENCOUNTER — Encounter: Payer: Self-pay | Admitting: Student in an Organized Health Care Education/Training Program

## 2021-10-29 ENCOUNTER — Ambulatory Visit (HOSPITAL_BASED_OUTPATIENT_CLINIC_OR_DEPARTMENT_OTHER): Payer: BC Managed Care – PPO | Admitting: Student in an Organized Health Care Education/Training Program

## 2021-10-29 VITALS — BP 120/70 | HR 64 | Temp 97.3°F | Resp 19 | Ht 60.0 in | Wt 150.0 lb

## 2021-10-29 DIAGNOSIS — M47816 Spondylosis without myelopathy or radiculopathy, lumbar region: Secondary | ICD-10-CM

## 2021-10-29 DIAGNOSIS — M47817 Spondylosis without myelopathy or radiculopathy, lumbosacral region: Secondary | ICD-10-CM | POA: Insufficient documentation

## 2021-10-29 MED ORDER — DEXAMETHASONE SODIUM PHOSPHATE 10 MG/ML IJ SOLN
10.0000 mg | Freq: Once | INTRAMUSCULAR | Status: AC
Start: 2021-10-29 — End: 2021-10-29
  Administered 2021-10-29: 10 mg

## 2021-10-29 MED ORDER — MIDAZOLAM HCL 5 MG/5ML IJ SOLN
INTRAMUSCULAR | Status: AC
  Filled 2021-10-29: qty 5

## 2021-10-29 MED ORDER — DEXAMETHASONE SODIUM PHOSPHATE 10 MG/ML IJ SOLN
INTRAMUSCULAR | Status: AC
  Filled 2021-10-29: qty 1

## 2021-10-29 MED ORDER — FENTANYL CITRATE (PF) 100 MCG/2ML IJ SOLN
INTRAMUSCULAR | Status: AC
  Filled 2021-10-29: qty 2

## 2021-10-29 MED ORDER — FENTANYL CITRATE (PF) 100 MCG/2ML IJ SOLN
25.0000 ug | INTRAMUSCULAR | Status: DC | PRN
  Administered 2021-10-29: 75 ug via INTRAVENOUS

## 2021-10-29 MED ORDER — MIDAZOLAM HCL 5 MG/5ML IJ SOLN
0.5000 mg | Freq: Once | INTRAMUSCULAR | Status: AC
  Administered 2021-10-29: 1 mg via INTRAVENOUS

## 2021-10-29 MED ORDER — ROPIVACAINE HCL 2 MG/ML IJ SOLN
9.0000 mL | Freq: Once | INTRAMUSCULAR | Status: AC
  Administered 2021-10-29: 9 mL via PERINEURAL

## 2021-10-29 MED ORDER — LACTATED RINGERS IV SOLN
1000.0000 mL | Freq: Once | INTRAVENOUS | Status: AC
  Administered 2021-10-29: 1000 mL via INTRAVENOUS

## 2021-10-29 MED ORDER — ROPIVACAINE HCL 2 MG/ML IJ SOLN
INTRAMUSCULAR | Status: AC
  Filled 2021-10-29: qty 20

## 2021-10-29 MED ORDER — LIDOCAINE HCL 2 % IJ SOLN
20.0000 mL | Freq: Once | INTRAMUSCULAR | Status: AC
  Administered 2021-10-29: 400 mg

## 2021-10-29 MED ORDER — LIDOCAINE HCL (PF) 2 % IJ SOLN
INTRAMUSCULAR | Status: AC
  Filled 2021-10-29: qty 15

## 2021-10-29 NOTE — Progress Notes (Signed)
Safety precautions to be maintained throughout the outpatient stay will include: orient to surroundings, keep bed in low position, maintain call bell within reach at all times, provide assistance with transfer out of bed and ambulation.  

## 2021-10-29 NOTE — Progress Notes (Signed)
PROVIDER NOTE: Interpretation of information contained herein should be left to medically-trained personnel. Specific patient instructions are provided elsewhere under "Patient Instructions" section of medical record. This document was created in part using STT-dictation technology, any transcriptional errors that may result from this process are unintentional.  ?Patient: Phyllis Smith ?Type: Established ?DOB: [birth year] ?MRN: [id number] ?PCP: [name], MD  Service: Procedure ?DOS: 10/29/2021 ?Setting: Ambulatory ?Location: Ambulatory outpatient facility ?Delivery: Face-to-face Provider: Gillis Santa, MD ?Specialty: Interventional Pain Management ?Specialty designation: 09 ?Location: Outpatient facility ?Ref. Prov.: [name], MD   ? ?Primary Reason for Visit: Interventional Pain Management Treatment. ?CC: Back Pain (Lower left) ? ?Procedure:          ? Type: Lumbar Facet, Medial Branch Radiofrequency Ablation (RFA) #1  ?Laterality: Left  ?Level: L3, L4, L5, Medial Branch Level(s). These levels will denervate the L3-4 and L5-S1 lumbar facet joints.  ?Imaging: Fluoroscopic guidance ?Anesthesia: Local anesthesia (1-2% Lidocaine) ? ?Sedation: Moderate conscious sedation. ?DOS: 10/29/2021  ?Performed by: Gillis Santa, MD ? ?Purpose: Therapeutic/Palliative ?Indications: Low back pain severe enough to impact quality of life or function. ?Indications: ?1. Spondylosis of lumbosacral region without myelopathy or radiculopathy   ?2. Lumbar facet arthropathy   ?3. Lumbar spondylosis   ? ? ?Phyllis Smith has been dealing with the above chronic pain for longer than three months and has either failed to respond, was unable to tolerate, or simply did not get enough benefit from other more conservative therapies including, but not limited to: ?1. Over-the-counter medications ?2. Anti-inflammatory medications ?3. Muscle relaxants ?4. Membrane stabilizers ?5. Opioids ?6. Physical therapy and/or chiropractic manipulation ?7.  Modalities (Heat, ice, etc.) ?8. Invasive techniques such as nerve blocks. ?Phyllis Smith has attained more than 50% relief of the pain from a series of diagnostic injections conducted in separate occasions. ? ?Pain Score: ?Pre-procedure: 3 /10 ?Post-procedure: 0-No pain/10 ? ?  ? ?Position / Prep / Materials:  ?Position: Prone  ?Prep solution: DuraPrep (Iodine Povacrylex [0.7% available iodine] and Isopropyl Alcohol, 74% w/w) ?Prep Area: Entire Lumbosacral Region (Lower back from mid-thoracic region to end of tailbone and from flank to flank.) ?Materials:  ?Tray: RFA (Radiofrequency) tray ?Needle(s):  ?Type: RFA (Teflon-coated radiofrequency ablation needles) ?Gauge (G): 22  ?Length: Regular (10cm) ?Qty: 3 ? ?Pre-op H&P Assessment:  ?Phyllis Smith is a 51 y.o. (year old), female patient, seen today for interventional treatment. She  has a past surgical history that includes Cesarean section (2001); Appendectomy (1994); Cesarean section (2007); Ankle fracture surgery (Right, 2014); Endometrial biopsy (05/15/2021); Robotic assisted laparoscopic hysterectomy and salpingectomy (N/A, 07/30/2021); and Abdominal hysterectomy. Phyllis Smith has a current medication list which includes the following prescription(s): acetaminophen, ibuprofen, oxycodone, and probiotic product, and the following Facility-Administered Medications: fentanyl. Her primarily concern today is the Back Pain (Lower left) ? ?Initial Vital Signs:  ?Pulse/HCG Rate: 64ECG Heart Rate: (!) 59 ?Temp: 97.8 ?F (36.6 ?C) ?Resp: 16 ?BP: 110/70 ?SpO2: 98 % ? ?BMI: Estimated body mass index is 29.29 kg/m? as calculated from the following: ?  Height as of this encounter: 5' (1.524 m). ?  Weight as of this encounter: 150 lb (68 kg). ? ?Risk Assessment: ?Allergies: Reviewed. She has No Known Allergies.  ?Allergy Precautions: None required ?Coagulopathies: Reviewed. None identified.  ?Blood-thinner therapy: None at this time ?Active Infection(s): Reviewed. None identified. Phyllis Smith is  afebrile ? ?Site Confirmation: Phyllis Smith was asked to confirm the procedure and laterality before marking the site ?Procedure checklist: Completed ?Consent: Before the procedure and  under the influence of no sedative(s), amnesic(s), or anxiolytics, the patient was informed of the treatment options, risks and possible complications. To fulfill our ethical and legal obligations, as recommended by the American Medical Association's Code of Ethics, I have informed the patient of my clinical impression; the nature and purpose of the treatment or procedure; the risks, benefits, and possible complications of the intervention; the alternatives, including doing nothing; the risk(s) and benefit(s) of the alternative treatment(s) or procedure(s); and the risk(s) and benefit(s) of doing nothing. ?The patient was provided information about the general risks and possible complications associated with the procedure. These may include, but are not limited to: failure to achieve desired goals, infection, bleeding, organ or nerve damage, allergic reactions, paralysis, and death. ?In addition, the patient was informed of those risks and complications associated to Spine-related procedures, such as failure to decrease pain; infection (i.e.: Meningitis, epidural or intraspinal abscess); bleeding (i.e.: epidural hematoma, subarachnoid hemorrhage, or any other type of intraspinal or peri-dural bleeding); organ or nerve damage (i.e.: Any type of peripheral nerve, nerve root, or spinal cord injury) with subsequent damage to sensory, motor, and/or autonomic systems, resulting in permanent pain, numbness, and/or weakness of one or several areas of the body; allergic reactions; (i.e.: anaphylactic reaction); and/or death. ?Furthermore, the patient was informed of those risks and complications associated with the medications. These include, but are not limited to: allergic reactions (i.e.: anaphylactic or anaphylactoid reaction(s)); adrenal axis  suppression; blood sugar elevation that in diabetics may result in ketoacidosis or comma; water retention that in patients with history of congestive heart failure may result in shortness of breath, pulmonary edema, and decompensation with resultant heart failure; weight gain; swelling or edema; medication-induced neural toxicity; particulate matter embolism and blood vessel occlusion with resultant organ, and/or nervous system infarction; and/or aseptic necrosis of one or more joints. ?Finally, the patient was informed that Medicine is not an exact science; therefore, there is also the possibility of unforeseen or unpredictable risks and/or possible complications that may result in a catastrophic outcome. The patient indicated having understood very clearly. We have given the patient no guarantees and we have made no promises. Enough time was given to the patient to ask questions, all of which were answered to the patient's satisfaction. Ms. Azzaro has indicated that she wanted to continue with the procedure. ?Attestation: I, the ordering provider, attest that I have discussed with the patient the benefits, risks, side-effects, alternatives, likelihood of achieving goals, and potential problems during recovery for the procedure that I have provided informed consent. ?Date  Time: 10/29/2021  8:12 AM ? ?Pre-Procedure Preparation:  ?Monitoring: As per clinic protocol. Respiration, ETCO2, SpO2, BP, heart rate and rhythm monitor placed and checked for adequate function ?Safety Precautions: Patient was assessed for positional comfort and pressure points before starting the procedure. ?Time-out: I initiated and conducted the "Time-out" before starting the procedure, as per protocol. The patient was asked to participate by confirming the accuracy of the "Time Out" information. Verification of the correct person, site, and procedure were performed and confirmed by me, the nursing staff, and the patient. "Time-out" conducted as  per Joint Commission's Universal Protocol (UP.01.01.01). ?Time: 0905 ? ?Description of Procedure:          ?Laterality: Left ?Levels:  L3, L4, L5, Medial Branch Level(s), at the L3-4 and L5-S1 lumbar fac

## 2021-10-29 NOTE — Patient Instructions (Signed)

## 2021-10-30 ENCOUNTER — Telehealth: Payer: Self-pay

## 2021-10-30 NOTE — Telephone Encounter (Signed)
Post procedure follow up.  LM 

## 2021-12-10 ENCOUNTER — Encounter: Payer: Self-pay | Admitting: Student in an Organized Health Care Education/Training Program

## 2021-12-10 ENCOUNTER — Ambulatory Visit
Payer: BC Managed Care – PPO | Attending: Student in an Organized Health Care Education/Training Program | Admitting: Student in an Organized Health Care Education/Training Program

## 2021-12-10 DIAGNOSIS — G894 Chronic pain syndrome: Secondary | ICD-10-CM

## 2021-12-10 DIAGNOSIS — M47816 Spondylosis without myelopathy or radiculopathy, lumbar region: Secondary | ICD-10-CM

## 2021-12-10 DIAGNOSIS — M47817 Spondylosis without myelopathy or radiculopathy, lumbosacral region: Secondary | ICD-10-CM | POA: Diagnosis not present

## 2021-12-10 MED ORDER — GABAPENTIN 100 MG PO CAPS: 100.0000 mg | ORAL_CAPSULE | Freq: Every day | ORAL | 0 refills | Status: DC

## 2021-12-10 NOTE — Assessment & Plan Note (Signed)
Unfortunately, Phyllis Smith did not receive the pain relief that we were hoping for after her left and right lumbar radiofrequency ablation.  She continues to have persistent low back pain with occasional radiation into her sacrum and coccyx.  She has done physical therapy in the past and continues to do home stretching exercises.  She has a standing desk so that she avoids sitting down.  Lumbar x-ray shows lumbar degenerative disc disease and mild to moderate facet arthropathy at L4-L5 and L5-S1.  I recommend following up with a lumbar MRI to see if there is any neuroforaminal stenosis or disc herniation that could be contributing to her symptoms.  I also recommend that she start gabapentin as below.  Continue acetaminophen and ibuprofen as needed.

## 2021-12-10 NOTE — Progress Notes (Signed)
Patient: Phyllis Smith  Service Category: E/M  Provider: Gillis Santa, MD  DOB: 1971-03-12  DOS: 12/10/2021  Location: Office  MRN: [id number]  Setting: Ambulatory outpatient  Referring Provider: [name], MD  Type: Established Patient  Specialty: Interventional Pain Management  PCP: [name], MD  Location: Remote location  Delivery: TeleHealth     Virtual Encounter - Pain Management PROVIDER NOTE: Information contained herein reflects review and annotations entered in association with encounter. Interpretation of such information and data should be left to medically-trained personnel. Information provided to patient can be located elsewhere in the medical record under "Patient Instructions". Document created using STT-dictation technology, any transcriptional errors that may result from process are unintentional.    Contact & Pharmacy Preferred: [place]: [phone] (home) Mobile: [phone] (mobile) E-mail: [email or web address]  [facility] [phone] [name], [place] [address] AT [place] [address] Phone: [phone] Fax: [phone]  [id number] - [place], [address] AT [address] [place] [address] Phone: [phone] Fax: [phone]   Pre-screening  Ms. Capo offered "in-person" vs "virtual" encounter. She indicated preferring virtual for this encounter.   Reason COVID-19*  Social distancing based on CDC and AMA recommendations.   I contacted Phyllis Smith on 12/10/2021 via telephone.      I clearly identified myself as Gillis Santa, MD. I verified that I was speaking with the correct person using two identifiers (Name: Phyllis Smith, and date of birth: Dec 10, 1970).  Consent I sought verbal advanced consent from Phyllis Smith for virtual visit interactions. I informed Phyllis Smith of possible security and privacy  concerns, risks, and limitations associated with providing "not-in-person" medical evaluation and management services. I also informed Phyllis Smith of the availability of "in-person" appointments. Finally, I informed her that there would be a charge for the virtual visit and that she could be  personally, fully or partially, financially responsible for it. Ms. Aune expressed understanding and agreed to proceed.   Historic Elements   Phyllis Smith is a 51 y.o. year old, female patient evaluated today after our last contact on 10/29/2021. Phyllis Smith  has a past medical history of Abnormal uterine bleeding (AUB), Broken ankle, right, closed, initial encounter (2014), History of COVID-19 (01/2020), and Wears glasses. She also  has a past surgical history that includes Cesarean section (2001); Appendectomy (1994); Cesarean section (2007); Ankle fracture surgery (Right, 2014); Endometrial biopsy (05/15/2021); Robotic assisted laparoscopic hysterectomy and salpingectomy (N/A, 07/30/2021); and Abdominal hysterectomy. Phyllis Smith has a current medication list which includes the following prescription(s): acetaminophen, estradiol, gabapentin, ibuprofen, oxycodone, and probiotic product. She  reports that she quit smoking about 11 years ago. Her smoking use included cigarettes and e-cigarettes. She has a 25.00 pack-year smoking history. She has never used smokeless tobacco. She reports current alcohol use of about 8.0 standard drinks per week. She reports current drug use. Drug: Marijuana. Ms. Wynes has No Known Allergies.   HPI  Today, she is being contacted for a post-procedure assessment.   Post-procedure evaluation   Type: Lumbar Facet, Medial Branch Radiofrequency Ablation (RFA) #1  Laterality: Left  Level: L3, L4, L5, Medial Branch Level(s). These levels will denervate the L3-4 and L5-S1 lumbar facet joints.  Imaging: Fluoroscopic guidance Anesthesia: Local anesthesia (1-2% Lidocaine)  Sedation: Moderate conscious  sedation. DOS: 10/29/2021  Performed by: Gillis Santa,  MD  Purpose: Therapeutic/Palliative Indications: Low back pain severe enough to impact quality of life or function. Indications: 1. Spondylosis of lumbosacral region without myelopathy or radiculopathy   2. Lumbar facet arthropathy   3. Lumbar spondylosis     Phyllis Smith has been dealing with the above chronic pain for longer than three months and has either failed to respond, was unable to tolerate, or simply did not get enough benefit from other more conservative therapies including, but not limited to: 1. Over-the-counter medications 2. Anti-inflammatory medications 3. Muscle relaxants 4. Membrane stabilizers 5. Opioids 6. Physical therapy and/or chiropractic manipulation 7. Modalities (Heat, ice, etc.) 8. Invasive techniques such as nerve blocks. Phyllis Smith has attained more than 50% relief of the pain from a series of diagnostic injections conducted in separate occasions.  Pain Score: Pre-procedure: 3 /10 Post-procedure: 0-No pain/10     Effectiveness:  Initial hour after procedure: 100 %  Subsequent 4-6 hours post-procedure: 100 % (states she was better for 1 week)  Analgesia past initial 6 hours: 60% Ongoing improvement:  Analgesic:  25-30% Function: Back to baseline ROM: Back to baseline   Assessment  The primary encounter diagnosis was Spondylosis of lumbosacral region without myelopathy or radiculopathy. Diagnoses of Lumbar facet arthropathy, Lumbar spondylosis, and Chronic pain syndrome were also pertinent to this visit.  Plan of Care  Problem-specific:  Lumbar spondylosis Unfortunately, Smith did not receive the pain relief that we were hoping for after her left and right lumbar radiofrequency ablation.  She continues to have persistent low back pain with occasional radiation into her sacrum and coccyx.  She has done physical therapy in the past and continues to do home stretching exercises.  She has a standing  desk so that she avoids sitting down.  Lumbar x-ray shows lumbar degenerative disc disease and mild to moderate facet arthropathy at L4-L5 and L5-S1.  I recommend following up with a lumbar MRI to see if there is any neuroforaminal stenosis or disc herniation that could be contributing to her symptoms.  I also recommend that she start gabapentin as below.  Continue acetaminophen and ibuprofen as needed.  Phyllis Smith has a current medication list which includes the following long-term medication(s): estradiol and gabapentin.  Pharmacotherapy (Medications Ordered): Meds ordered this encounter  Medications   gabapentin (NEURONTIN) 100 MG capsule    Sig: Take 1-3 capsules (100-300 mg total) by mouth at bedtime. Follow written titration schedule.    Dispense:  90 capsule    Refill:  0    Fill one day early if pharmacy is closed on scheduled refill date. May substitute for generic if available.   Orders:  Orders Placed This Encounter  Procedures   MR LUMBAR SPINE WO CONTRAST    Patient presents with axial pain with possible radicular component. Please assist [place] in identifying specific level(s) and laterality of any additional findings such as: 1. Facet (Zygapophyseal) joint DJD (Hypertrophy, space narrowing, subchondral sclerosis, and/or osteophyte formation) 2. DDD and/or IVDD (Loss of disc height, desiccation, gas patterns, osteophytes, endplate sclerosis, or "Black disc disease") 3. Pars defects 4. Spondylolisthesis, spondylosis, and/or spondyloarthropathies (include Degree/Grade of displacement in mm) (stability) 5. Vertebral body Fractures (acute/chronic) (state percentage of collapse) 6. Demineralization (osteopenia/osteoporotic) 7. Bone pathology 8. Foraminal narrowing  9. Surgical changes 10. Central, Lateral Recess, and/or Foraminal Stenosis (include AP diameter of stenosis in mm) 11. Surgical changes (hardware type, status, and presence of fibrosis) 12. Modic Type Changes (MRI  only) 13. IVDD (Disc bulge,  protrusion, herniation, extrusion) (Level, laterality, extent)    Standing Status:   Future    Standing Expiration Date:   01/10/2022    Scheduling Instructions:     Imaging must be done as soon as possible. Inform patient that order will expire within 30 days and I will not renew it.    Order Specific Question:   What is the patient's sedation requirement?    Answer:   No Sedation    Order Specific Question:   Does the patient have a pacemaker or implanted devices?    Answer:   No    Order Specific Question:   Preferred imaging location?    Answer:   [facility] (table limit-350lbs)    Order Specific Question:   Call Results- Best Contact Number?    Answer:   [phone] ([facility])    Order Specific Question:   Radiology Contrast Protocol - do NOT remove file path    Answer:   \\[email or web address]   Follow-up plan:   Return for I will call patient after imaging.     R L3,4,5 RFA 10/15/21, Left L3, 4, 5 RFA 10/29/21    Recent Visits Date Type Provider Dept  10/29/21 Procedure visit Gillis Santa, MD [facility]  10/15/21 Procedure visit Gillis Santa, MD [facility]  09/30/21 Office Visit Gillis Santa, MD [facility]  Showing recent visits within past 90 days and meeting all other requirements Today's Visits Date Type Provider Dept  12/10/21 Office Visit Gillis Santa, MD [facility]  Showing today's visits and meeting all other requirements Future Appointments No visits were found meeting these conditions. Showing future appointments within next 90 days and meeting all other requirements  I discussed the assessment and treatment plan with the patient. The patient was provided an opportunity to ask questions and all were answered. The patient agreed with the plan and demonstrated an understanding of the instructions.  Patient advised to call back or seek an in-person  evaluation if the symptoms or condition worsens.  Duration of encounter: 71mnutes.  Note by: BGillis Santa MD Date: 12/10/2021; Time: 11:32 AM

## 2021-12-23 ENCOUNTER — Ambulatory Visit
Admission: RE | Admit: 2021-12-23 | Discharge: 2021-12-23 | Disposition: A | Discharge status: Home / Self Care | Payer: BC Managed Care – PPO | Source: Ambulatory Visit | Attending: Student in an Organized Health Care Education/Training Program | Admitting: Student in an Organized Health Care Education/Training Program

## 2021-12-23 DIAGNOSIS — G894 Chronic pain syndrome: Secondary | ICD-10-CM | POA: Diagnosis not present

## 2021-12-23 DIAGNOSIS — M47816 Spondylosis without myelopathy or radiculopathy, lumbar region: Secondary | ICD-10-CM | POA: Diagnosis not present

## 2021-12-23 DIAGNOSIS — M4316 Spondylolisthesis, lumbar region: Secondary | ICD-10-CM | POA: Diagnosis not present

## 2021-12-23 DIAGNOSIS — M545 Low back pain, unspecified: Secondary | ICD-10-CM | POA: Diagnosis not present

## 2021-12-23 DIAGNOSIS — M47817 Spondylosis without myelopathy or radiculopathy, lumbosacral region: Secondary | ICD-10-CM | POA: Insufficient documentation

## 2021-12-24 ENCOUNTER — Ambulatory Visit
Payer: BC Managed Care – PPO | Attending: Student in an Organized Health Care Education/Training Program | Admitting: Student in an Organized Health Care Education/Training Program

## 2021-12-24 ENCOUNTER — Encounter: Payer: Self-pay | Admitting: Student in an Organized Health Care Education/Training Program

## 2021-12-24 DIAGNOSIS — M47817 Spondylosis without myelopathy or radiculopathy, lumbosacral region: Secondary | ICD-10-CM

## 2021-12-24 DIAGNOSIS — G894 Chronic pain syndrome: Secondary | ICD-10-CM

## 2021-12-24 DIAGNOSIS — M47816 Spondylosis without myelopathy or radiculopathy, lumbar region: Secondary | ICD-10-CM | POA: Diagnosis not present

## 2021-12-24 DIAGNOSIS — M533 Sacrococcygeal disorders, not elsewhere classified: Secondary | ICD-10-CM

## 2021-12-24 NOTE — Progress Notes (Signed)
Patient: Phyllis Smith  Service Category: E/M  Provider: Gillis Santa, MD  DOB: [birth year]  DOS: 12/24/2021  Location: Office  MRN: [id number]  Setting: Ambulatory outpatient  Referring Provider: [name], MD  Type: Established Patient  Specialty: Interventional Pain Management  PCP: [name], MD  Location: Remote location  Delivery: TeleHealth     Virtual Encounter - Pain Management PROVIDER NOTE: Information contained herein reflects review and annotations entered in association with encounter. Interpretation of such information and data should be left to medically-trained personnel. Information provided to patient can be located elsewhere in the medical record under "Patient Instructions". Document created using STT-dictation technology, any transcriptional errors that may result from process are unintentional.    Contact & Pharmacy Preferred: [phone] Home: [phone] (home) Mobile: [phone] (mobile) E-mail: [email or web address]  [place], [place] [address] AT [place] [address] Phone: [phone] Fax: [phone]  [id number] - [place], [address] AT [address] [place] [address] Phone: [phone] Fax: [phone]   Pre-screening  Ms. Mcvicar offered "in-person" vs "virtual" encounter. She indicated preferring virtual for this encounter.   Reason COVID-19*  Social distancing based on CDC and AMA recommendations.   I contacted Gustie R Schnyder on 12/24/2021 via telephone.      I clearly identified myself as Phyllis Santa, MD. I verified that I was speaking with the correct person using two identifiers (Name: Phyllis Smith, and date of birth: 06-Sep-1970).  Consent I sought verbal advanced consent from Millena R Vanderzee for virtual visit interactions. I informed [name] of possible security and privacy  concerns, risks, and limitations associated with providing "not-in-person" medical evaluation and management services. I also informed [name] of the availability of "in-person" appointments. Finally, I informed her that there would be a charge for the virtual visit and that she could be  personally, fully or partially, financially responsible for it. Ms. Provence expressed understanding and agreed to proceed.   Historic Elements   Phyllis Smith is a 51 y.o. year old, female patient evaluated today after our last contact on 12/10/2021. Ms. Friis  has a past medical history of Abnormal uterine bleeding (AUB), Broken ankle, right, closed, initial encounter (2014), History of COVID-19 (01/2020), and Wears glasses. She also  has a past surgical history that includes Cesarean section (2001); Appendectomy (1994); Cesarean section (2007); Ankle fracture surgery (Right, 2014); Endometrial biopsy (05/15/2021); Robotic assisted laparoscopic hysterectomy and salpingectomy (N/A, 07/30/2021); and Abdominal hysterectomy. [name] has a current medication list which includes the following prescription(s): acetaminophen, estradiol, gabapentin, ibuprofen, oxycodone, and probiotic product. She  reports that she quit smoking about 11 years ago. Her smoking use included cigarettes and e-cigarettes. She has a 25.00 pack-year smoking history. She has never used smokeless tobacco. She reports current alcohol use of about 8.0 standard drinks per week. She reports current drug use. Drug: Marijuana. [name] has No Known Allergies.   HPI  Today, she is being contacted for  review L-MRI  Reason for encounter: Review lumbar MRI.   Patient continues to endorse sacral/coccyx pain that is more midline.  She has less low back pain and radiating hip pain since her radiofrequency ablation.  Imaging  MR LUMBAR SPINE WO CONTRAST CLINICAL DATA:  Low back pain for over 6 weeks.  EXAM: MRI LUMBAR SPINE WITHOUT  CONTRAST  TECHNIQUE: Multiplanar, multisequence MR imaging of the lumbar spine was performed. No intravenous contrast was administered.  COMPARISON:  None Available.  FINDINGS: Segmentation:  Standard.  Alignment:  Minimal grade 1 anterolisthesis of L4 on L5.  Vertebrae: No acute fracture, evidence of discitis, or aggressive bone lesion.  Conus medullaris and cauda equina: Conus extends to the L1-2 level. Conus and cauda equina appear normal.  Paraspinal and other soft tissues: No acute paraspinal abnormality.  Disc levels:  Disc spaces: No acute paraspinal abnormality.  T12-L1: No significant disc bulge. No neural foraminal stenosis. No central canal stenosis.  L1-L2: No significant disc bulge. No neural foraminal stenosis. No central canal stenosis.  L2-L3: No significant disc bulge. No neural foraminal stenosis. No central canal stenosis.  L3-L4: No significant disc bulge. No neural foraminal stenosis. No central canal stenosis.  L4-L5: Minimal broad-based disc bulge. Mild-moderate bilateral facet arthropathy. No foraminal or central canal stenosis.  L5-S1: No significant disc bulge. No neural foraminal stenosis. No central canal stenosis. Mild bilateral facet arthropathy.  IMPRESSION: 1.  No acute osseous injury of the lumbar spine. 2. At L4-5 there is a minimal broad-based disc bulge. Mild-moderate bilateral facet arthropathy. 3. At L5-S1 there is mild bilateral facet arthropathy.  Electronically Signed   By: Kathreen Devoid M.D.   On: 12/24/2021 06:58  Assessment  The primary encounter diagnosis was Coccydynia. Diagnoses of Spondylosis of lumbosacral region without myelopathy or radiculopathy, Lumbar facet arthropathy, Lumbar spondylosis, and Chronic pain syndrome were also pertinent to this visit.  Plan of Care  Reviewed MRI with patient in great detail.  Mild facet arthropathy.  However she is continuing to endorse vague, sacral pain.  Discussed trigger  point injection in the sacrum/coccyx under fluoroscopy.  Risk and benefits reviewed and patient would like to proceed with that.   Orders:  Orders Placed This Encounter  Procedures   TRIGGER POINT INJECTION    Standing Status:   Future    Standing Expiration Date:   03/26/2022    Scheduling Instructions:     Sacral TPI in clinic NS    Order Specific Question:   Where will this procedure be performed?    Answer:   [facility]   Follow-up plan:   Return in about 2 weeks (around 01/07/2022) for Sacral TPI, in clinic NS.     R L3,4,5 RFA 10/15/21, Left L3, 4, 5 RFA 10/29/21     Recent Visits Date Type Provider Dept  12/10/21 Office Visit Phyllis Santa, MD [facility]  10/29/21 Procedure visit Phyllis Santa, MD [facility]  10/15/21 Procedure visit Phyllis Santa, MD [facility]  09/30/21 Office Visit Phyllis Santa, MD [facility]  Showing recent visits within past 90 days and meeting all other requirements Today's Visits Date Type Provider Dept  12/24/21 Office Visit Phyllis Santa, MD [facility]  Showing today's visits and meeting all other requirements Future Appointments No visits were found meeting these conditions. Showing future appointments within next 90 days and meeting all other requirements  I discussed the assessment and treatment plan with the patient. The patient was provided an opportunity to ask questions and all were answered. The patient agreed with the plan and demonstrated an understanding of the instructions.  Patient advised to call back or seek an in-person evaluation if the symptoms or condition worsens.  Duration of encounter: 78mnutes.  Note by: BGillis Santa MD Date: 12/24/2021; Time: 3:32 PM

## 2022-01-06 ENCOUNTER — Other Ambulatory Visit: Payer: Self-pay | Admitting: Student in an Organized Health Care Education/Training Program

## 2022-01-06 DIAGNOSIS — M47816 Spondylosis without myelopathy or radiculopathy, lumbar region: Secondary | ICD-10-CM

## 2022-01-06 DIAGNOSIS — M47817 Spondylosis without myelopathy or radiculopathy, lumbosacral region: Secondary | ICD-10-CM

## 2022-01-06 DIAGNOSIS — G894 Chronic pain syndrome: Secondary | ICD-10-CM

## 2022-01-07 ENCOUNTER — Encounter: Payer: Self-pay | Admitting: Student in an Organized Health Care Education/Training Program

## 2022-01-07 ENCOUNTER — Ambulatory Visit
Admission: RE | Admit: 2022-01-07 | Discharge: 2022-01-07 | Disposition: A | Discharge status: Home / Self Care | Payer: BC Managed Care – PPO | Source: Ambulatory Visit | Attending: Student in an Organized Health Care Education/Training Program | Admitting: Student in an Organized Health Care Education/Training Program

## 2022-01-07 ENCOUNTER — Ambulatory Visit
Payer: BC Managed Care – PPO | Attending: Student in an Organized Health Care Education/Training Program | Admitting: Student in an Organized Health Care Education/Training Program

## 2022-01-07 VITALS — BP 116/67 | HR 62 | Temp 97.5°F | Ht 60.0 in | Wt 150.0 lb

## 2022-01-07 DIAGNOSIS — M533 Sacrococcygeal disorders, not elsewhere classified: Secondary | ICD-10-CM

## 2022-01-07 MED ORDER — ROPIVACAINE HCL 2 MG/ML IJ SOLN
INTRAMUSCULAR | Status: AC
  Filled 2022-01-07: qty 20

## 2022-01-07 MED ORDER — ROPIVACAINE HCL 2 MG/ML IJ SOLN
4.0000 mL | Freq: Once | INTRAMUSCULAR | Status: AC
  Administered 2022-01-07: 4 mL via INTRA_ARTICULAR

## 2022-01-07 MED ORDER — DEXAMETHASONE SODIUM PHOSPHATE 10 MG/ML IJ SOLN
10.0000 mg | Freq: Once | INTRAMUSCULAR | Status: AC
  Administered 2022-01-07: 10 mg
  Filled 2022-01-07: qty 1

## 2022-01-07 MED ORDER — LIDOCAINE HCL 2 % IJ SOLN
20.0000 mL | Freq: Once | INTRAMUSCULAR | Status: AC
Start: 2022-01-07 — End: 2022-01-07
  Administered 2022-01-07: 400 mg
  Filled 2022-01-07: qty 20

## 2022-01-07 NOTE — Patient Instructions (Signed)

## 2022-01-07 NOTE — Progress Notes (Signed)
PROVIDER NOTE: Interpretation of information contained herein should be left to medically-trained personnel. Specific patient instructions are provided elsewhere under "Patient Instructions" section of medical record. This document was created in part using STT-dictation technology, any transcriptional errors that may result from this process are unintentional.  Patient: Phyllis Smith Type: Established DOB: August 23, 1970 MRN: [id number] PCP: [name], MD  Service: Procedure DOS: 01/07/2022 Setting: Ambulatory Location: Ambulatory outpatient facility Delivery: Face-to-face Provider: Gillis Santa, MD Specialty: Interventional Pain Management Specialty designation: 09 Location: Outpatient facility Ref. Prov.: [name], MD    Primary Reason for Visit: Interventional Pain Management Treatment. CC: Rectal Pain    Procedure:          Anesthesia, Analgesia, Anxiolysis:  Sacral trigger point injection under fluoroscopy.  Type: Local Anesthesia Local Anesthetic: Lidocaine 1-2% Sedation: None  Indication(s):  Analgesia Route: Infiltration ([place]/IM) IV Access: N/A   Position: Prone   1. Coccydynia    NAS-11 Pain score:   Pre-procedure: 4 /10   Post-procedure: 4 /10     Pre-op H&P Assessment:  Phyllis Smith is a 51 y.o. (year old), female patient, seen today for interventional treatment. She  has a past surgical history that includes Cesarean section (2001); Appendectomy (1994); Cesarean section (2007); Ankle fracture surgery (Right, 2014); Endometrial biopsy (05/15/2021); Robotic assisted laparoscopic hysterectomy and salpingectomy (N/A, 07/30/2021); and Abdominal hysterectomy. Phyllis Smith has a current medication list which includes the following prescription(s): acetaminophen, estradiol, gabapentin, ibuprofen, oxycodone, and probiotic product. Her primarily concern today is the Rectal Pain  Initial Vital Signs:  Pulse/HCG Rate: 62  Temp: (!) 97.5 F (36.4 C) Resp:   BP:  116/67 SpO2: 100 %  BMI: Estimated body mass index is 29.29 kg/m as calculated from the following:   Height as of this encounter: 5' (1.524 m).   Weight as of this encounter: 150 lb (68 kg).  Risk Assessment: Allergies: Reviewed. She has No Known Allergies.  Allergy Precautions: None required Coagulopathies: Reviewed. None identified.  Blood-thinner therapy: None at this time Active Infection(s): Reviewed. None identified. Phyllis Smith is afebrile  Site Confirmation: Phyllis Smith was asked to confirm the procedure and laterality before marking the site Procedure checklist: Completed Consent: Before the procedure and under the influence of no sedative(s), amnesic(s), or anxiolytics, the patient was informed of the treatment options, risks and possible complications. To fulfill our ethical and legal obligations, as recommended by the American Medical Association's Code of Ethics, I have informed the patient of my clinical impression; the nature and purpose of the treatment or procedure; the risks, benefits, and possible complications of the intervention; the alternatives, including doing nothing; the risk(s) and benefit(s) of the alternative treatment(s) or procedure(s); and the risk(s) and benefit(s) of doing nothing. The patient was provided information about the general risks and possible complications associated with the procedure. These may include, but are not limited to: failure to achieve desired goals, infection, bleeding, organ or nerve damage, allergic reactions, paralysis, and death. In addition, the patient was informed of those risks and complications associated to the procedure, such as failure to decrease pain; infection; bleeding; organ or nerve damage with subsequent damage to sensory, motor, and/or autonomic systems, resulting in permanent pain, numbness, and/or weakness of one or several areas of the body; allergic reactions; (i.e.: anaphylactic reaction); and/or death. Furthermore, the  patient was informed of those risks and complications associated with the medications. These include, but are not limited to: allergic reactions (i.e.: anaphylactic or anaphylactoid reaction(s)); adrenal axis suppression;  blood sugar elevation that in diabetics may result in ketoacidosis or comma; water retention that in patients with history of congestive heart failure may result in shortness of breath, pulmonary edema, and decompensation with resultant heart failure; weight gain; swelling or edema; medication-induced neural toxicity; particulate matter embolism and blood vessel occlusion with resultant organ, and/or nervous system infarction; and/or aseptic necrosis of one or more joints. Finally, the patient was informed that Medicine is not an exact science; therefore, there is also the possibility of unforeseen or unpredictable risks and/or possible complications that may result in a catastrophic outcome. The patient indicated having understood very clearly. We have given the patient no guarantees and we have made no promises. Enough time was given to the patient to ask questions, all of which were answered to the patient's satisfaction. Phyllis Smith has indicated that she wanted to continue with the procedure. Attestation: I, the ordering provider, attest that I have discussed with the patient the benefits, risks, side-effects, alternatives, likelihood of achieving goals, and potential problems during recovery for the procedure that I have provided informed consent. Date  Time: 01/07/2022 11:36 AM  Pre-Procedure Preparation:  Monitoring: As per clinic protocol. Respiration, ETCO2, SpO2, BP, heart rate and rhythm monitor placed and checked for adequate function Safety Precautions: Patient was assessed for positional comfort and pressure points before starting the procedure. Time-out: I initiated and conducted the "Time-out" before starting the procedure, as per protocol. The patient was asked to participate  by confirming the accuracy of the "Time Out" information. Verification of the correct person, site, and procedure were performed and confirmed by me, the nursing staff, and the patient. "Time-out" conducted as per Joint Commission's Universal Protocol (UP.01.01.01). Time: 1156  Description of Procedure:          Area Prepped: Entire             Region DuraPrep (Iodine Povacrylex [0.7% available iodine] and Isopropyl Alcohol, 74% w/w) Safety Precautions: Aspiration looking for blood return was conducted prior to all injections. At no point did we inject any substances, as a needle was being advanced. No attempts were made at seeking any paresthesias. Safe injection practices and needle disposal techniques used. Medications properly checked for expiration dates. SDV (single dose vial) medications used. Description of the Procedure: Protocol guidelines were followed. The patient was placed in position over the fluoroscopy table. The target area was identified and the area prepped in the usual manner. Skin & deeper tissues infiltrated with local anesthetic. Appropriate amount of time allowed to pass for local anesthetics to take effect. The procedure needles were then advanced to the target area. Proper needle placement secured. Negative aspiration confirmed. Solution injected in intermittent fashion, asking for systemic symptoms every 0.5cc of injectate. The needles were then removed and the area cleansed, making sure to leave some of the prepping solution back to take advantage of its long term bactericidal properties.  Vitals:   01/07/22 1141  BP: 116/67  Pulse: 62  Temp: (!) 97.5 F (36.4 C)  SpO2: 100%  Weight: 150 lb (68 kg)  Height: 5' (1.524 m)    Start Time: 1156 hrs. End Time: 1200 hrs. Materials:  Needle(s) Type: Regular needle Gauge: 22G Length: 1.5-in Medication(s): Please see orders for medications and dosing details.  Point of maximal tenderness to palpation identified at the  inferior sacrum with a clamp.  Trigger point injection performed in the inferior sacral region with a 5 cc solution consisting of 4 cc of 0.2% ropivacaine, 1  cc of Decadron 10 mg/cc.  Needling was also performed.  Post-operative Assessment:  Post-procedure Vital Signs:  Pulse/HCG Rate: 62  Temp:  (!) 97.5 F (36.4 C) Resp:   BP: 116/67 SpO2: 100 %  EBL: None  Complications: No immediate post-treatment complications observed by team, or reported by patient.  Note: The patient tolerated the entire procedure well. A repeat set of vitals were taken after the procedure and the patient was kept under observation following institutional policy, for this type of procedure. Post-procedural neurological assessment was performed, showing return to baseline, prior to discharge. The patient was provided with post-procedure discharge instructions, including a section on how to identify potential problems. Should any problems arise concerning this procedure, the patient was given instructions to immediately contact us, at any time, without hesitation. In any case, we plan to contact the patient by telephone for a follow-up status report regarding this interventional procedure.  Comments:  No additional relevant information.  Plan of Care  Orders:  Orders Placed This Encounter  Procedures   DG PAIN CLINIC C-ARM 1-60 MIN NO REPORT    Intraoperative interpretation by procedural physician at [facility].    Standing Status:   Standing    Number of Occurrences:   1    Order Specific Question:   Reason for exam:    Answer:   Assistance in needle guidance and placement for procedures requiring needle placement in or near specific anatomical locations not easily accessible without such assistance.    Medications ordered for procedure: Meds ordered this encounter  Medications   lidocaine (XYLOCAINE) 2 % (with pres) injection 400 mg   dexamethasone (DECADRON) injection 10 mg   ropivacaine (PF)  2 mg/mL (0.2%) (NAROPIN) injection 4 mL   Medications administered: We administered lidocaine, dexamethasone, and ropivacaine (PF) 2 mg/mL (0.2%).  See the medical record for exact dosing, route, and time of administration.  Follow-up plan:   Return in about 4 weeks (around 02/04/2022) for Post Procedure Evaluation, virtual.       R L3,4,5 RFA 10/15/21, Left L3, 4, 5 RFA 10/29/21      Recent Visits Date Type Provider Dept  12/24/21 Office Visit Gillis Santa, MD [facility]  12/10/21 Office Visit Gillis Santa, MD [facility]  10/29/21 Procedure visit Gillis Santa, MD [facility]  10/15/21 Procedure visit Gillis Santa, MD [facility]  Showing recent visits within past 90 days and meeting all other requirements Today's Visits Date Type Provider Dept  01/07/22 Procedure visit Gillis Santa, MD [facility]  Showing today's visits and meeting all other requirements Future Appointments Date Type Provider Dept  02/03/22 Appointment Gillis Santa, MD [facility]  Showing future appointments within next 90 days and meeting all other requirements  Disposition: Discharge home  Discharge (Date  Time): 01/07/2022; 1204 hrs.   Primary Care Physician: [name], MD Location: [facility] Note by: Gillis Santa, MD Date: 01/07/2022; Time: 1:27 PM  Disclaimer:  Medicine is not an exact science. The only guarantee in medicine is that nothing is guaranteed. It is important to note that the decision to proceed with this intervention was based on the information collected from the patient. The Data and conclusions were drawn from the patient's questionnaire, the interview, and the physical examination. Because the information was provided in large part by the patient, it cannot be guaranteed that it has not been purposely or unconsciously manipulated. Every effort has been made to  obtain as much relevant  data as possible for this evaluation. It is important to note that the conclusions that lead to this procedure are derived in large part from the available data. Always take into account that the treatment will also be dependent on availability of resources and existing treatment guidelines, considered by other Pain Management Practitioners as being common knowledge and practice, at the time of the intervention. For Medico-Legal purposes, it is also important to point out that variation in procedural techniques and pharmacological choices are the acceptable norm. The indications, contraindications, technique, and results of the above procedure should only be interpreted and judged by a Board-Certified Interventional Pain Specialist with extensive familiarity and expertise in the same exact procedure and technique.

## 2022-01-08 ENCOUNTER — Telehealth: Payer: Self-pay

## 2022-01-08 NOTE — Telephone Encounter (Signed)
Post procedure phone call.  LM 

## 2022-01-19 DIAGNOSIS — F5101 Primary insomnia: Secondary | ICD-10-CM | POA: Diagnosis not present

## 2022-01-19 DIAGNOSIS — N951 Menopausal and female climacteric states: Secondary | ICD-10-CM | POA: Diagnosis not present

## 2022-02-02 ENCOUNTER — Telehealth: Payer: Self-pay

## 2022-02-02 NOTE — Telephone Encounter (Signed)
Called patient no answer. Left message to call to review current meds and pocedure from last visit.

## 2022-02-03 ENCOUNTER — Encounter: Payer: Self-pay | Admitting: Student in an Organized Health Care Education/Training Program

## 2022-02-03 ENCOUNTER — Ambulatory Visit
Payer: BC Managed Care – PPO | Attending: Student in an Organized Health Care Education/Training Program | Admitting: Student in an Organized Health Care Education/Training Program

## 2022-02-03 DIAGNOSIS — M47817 Spondylosis without myelopathy or radiculopathy, lumbosacral region: Secondary | ICD-10-CM | POA: Diagnosis not present

## 2022-02-03 DIAGNOSIS — M533 Sacrococcygeal disorders, not elsewhere classified: Secondary | ICD-10-CM

## 2022-02-03 DIAGNOSIS — M47816 Spondylosis without myelopathy or radiculopathy, lumbar region: Secondary | ICD-10-CM | POA: Diagnosis not present

## 2022-02-03 DIAGNOSIS — G894 Chronic pain syndrome: Secondary | ICD-10-CM | POA: Diagnosis not present

## 2022-02-03 NOTE — Progress Notes (Signed)
Patient: Phyllis Smith  Service Category: E/M  Provider: Gillis Santa, MD  DOB: [birth year]  DOS: 02/03/2022  Location: Office  MRN: [id number]  Setting: Ambulatory outpatient  Referring Provider: [name], MD  Type: Established Patient  Specialty: Interventional Pain Management  PCP: [name], MD  Location: Remote location  Delivery: TeleHealth     Virtual Encounter - Pain Management PROVIDER NOTE: Information contained herein reflects review and annotations entered in association with encounter. Interpretation of such information and data should be left to medically-trained personnel. Information provided to patient can be located elsewhere in the medical record under "Patient Instructions". Document created using STT-dictation technology, any transcriptional errors that may result from process are unintentional.    Contact & Pharmacy Preferred: [place]: [phone] (home) Mobile: [phone] (mobile) E-mail: [email or web address] .[facility] [phone] [name], [place] [address] AT [place] [address] Phone: [phone] Fax: [phone]  [id number] - [place], [place] [address] [facility] [address] [facility] [address] [address] AT [address] [place] [address] Phone: [phone] Fax: [phone]   Pre-screening  Phyllis Smith offered "in-person" vs "virtual" encounter. She indicated preferring virtual for this encounter.   Reason COVID-19*  Social distancing based on CDC and AMA recommendations.   I contacted Phyllis Smith on 02/03/2022 via telephone.      I clearly identified myself as Gillis Santa, MD. I verified that I was speaking with the correct person using two identifiers (Name: Phyllis Smith, and date of birth: May 27, 1971).  Consent I sought verbal advanced consent from Phyllis Smith for virtual visit interactions. I informed Phyllis Smith of possible security and privacy  concerns, risks, and limitations associated with providing "not-in-person" medical evaluation and management services. I also informed Phyllis Smith of the availability of "in-person" appointments. Finally, I informed her that there would be a charge for the virtual visit and that she could be  personally, fully or partially, financially responsible for it. Phyllis Smith expressed understanding and agreed to proceed.   Historic Elements   Phyllis Smith is a 51 y.o. year old, female patient evaluated today after our last contact on 01/07/2022. Phyllis Smith  has a past medical history of Abnormal uterine bleeding (AUB), Broken ankle, right, closed, initial encounter (2014), History of COVID-19 (01/2020), and Wears glasses. She also  has a past surgical history that includes Cesarean section (2001); Appendectomy (1994); Cesarean section (2007); Ankle fracture surgery (Right, 2014); Endometrial biopsy (05/15/2021); Robotic assisted laparoscopic hysterectomy and salpingectomy (N/A, 07/30/2021); and Abdominal hysterectomy. Phyllis Smith has a current medication list which includes the following prescription(s): acetaminophen, estradiol, ibuprofen, gabapentin, and oxycodone. She  reports that she quit smoking about 11 years ago. Her smoking use included cigarettes and e-cigarettes. She has a 25.00 pack-year smoking history. She has never used smokeless tobacco. She reports current alcohol use of about 8.0 standard drinks of alcohol per week. She reports current drug use. Drug: Marijuana. Phyllis Smith has No Known Allergies.   HPI  Today, she is being contacted for a post-procedure assessment.   Post-procedure evaluation    Procedure:          Anesthesia, Analgesia, Anxiolysis:  Sacral trigger point injection under fluoroscopy.  Type: Local Anesthesia Local Anesthetic: Lidocaine 1-2% Sedation: None  Indication(s):  Analgesia Route: Infiltration ([place]/IM) IV Access: N/A   Position: Prone   1. Coccydynia    NAS-11  Pain score:    Pre-procedure: 4 /10   Post-procedure: 4 /10      Effectiveness:  Initial hour after procedure: 100 %  Subsequent 4-6 hours post-procedure: 100 %  Analgesia past initial 6 hours: 90 %  Ongoing improvement:  Analgesic:  80-85% Function: Somewhat improved ROM: Somewhat improved    Assessment  The primary encounter diagnosis was Coccydynia. Diagnoses of Spondylosis of lumbosacral region without myelopathy or radiculopathy, Lumbar facet arthropathy, Lumbar spondylosis, and Chronic pain syndrome were also pertinent to this visit.  Plan of Care   Phyllis Smith has a current medication list which includes the following long-term medication(s): estradiol and gabapentin.   Orders:  Orders Placed This Encounter  Procedures   TRIGGER POINT INJECTION    Standing Status:   Standing    Number of Occurrences:   2    Standing Expiration Date:   08/06/2022    Scheduling Instructions:     Sacral TPI    Order Specific Question:   Where will this procedure be performed?    Answer:   [facility]   Follow-up plan:   Return for PRN- repeat TPI.     R L3,4,5 RFA 10/15/21, Left L3, 4, 5 RFA 10/29/21, sacral TPI- 01/07/22   Recent Visits Date Type Provider Dept  01/07/22 Procedure visit Gillis Santa, MD [facility]  12/24/21 Office Visit Gillis Santa, MD [facility]  12/10/21 Office Visit Gillis Santa, MD [facility]  Showing recent visits within past 90 days and meeting all other requirements Today's Visits Date Type Provider Dept  02/03/22 Office Visit Gillis Santa, MD [facility]  Showing today's visits and meeting all other requirements Future Appointments No visits were found meeting these conditions. Showing future appointments within next 90 days and meeting all other requirements  I discussed the assessment and treatment plan with the patient. The patient was provided an opportunity to ask questions and all were answered. The  patient agreed with the plan and demonstrated an understanding of the instructions.  Patient advised to call back or seek an in-person evaluation if the symptoms or condition worsens.  Duration of encounter: 59mnutes.  Note by: BGillis Santa MD Date: 02/03/2022; Time: 3:49 PM

## 2022-02-09 ENCOUNTER — Ambulatory Visit
Payer: BC Managed Care – PPO | Attending: Student in an Organized Health Care Education/Training Program | Admitting: Student in an Organized Health Care Education/Training Program

## 2022-02-09 ENCOUNTER — Ambulatory Visit
Admission: RE | Admit: 2022-02-09 | Discharge: 2022-02-09 | Disposition: A | Discharge status: Home / Self Care | Payer: BC Managed Care – PPO | Source: Ambulatory Visit | Attending: Student in an Organized Health Care Education/Training Program | Admitting: Student in an Organized Health Care Education/Training Program

## 2022-02-09 ENCOUNTER — Encounter: Payer: Self-pay | Admitting: Student in an Organized Health Care Education/Training Program

## 2022-02-09 VITALS — BP 119/70 | HR 77 | Temp 97.3°F | Resp 18 | Ht 60.0 in | Wt 150.0 lb

## 2022-02-09 DIAGNOSIS — M533 Sacrococcygeal disorders, not elsewhere classified: Secondary | ICD-10-CM

## 2022-02-09 DIAGNOSIS — G894 Chronic pain syndrome: Secondary | ICD-10-CM

## 2022-02-09 MED ORDER — ROPIVACAINE HCL 2 MG/ML IJ SOLN
INTRAMUSCULAR | Status: AC
  Filled 2022-02-09: qty 20

## 2022-02-09 MED ORDER — DEXAMETHASONE SODIUM PHOSPHATE 10 MG/ML IJ SOLN
10.0000 mg | Freq: Once | INTRAMUSCULAR | Status: AC
  Administered 2022-02-09: 10 mg

## 2022-02-09 MED ORDER — DEXAMETHASONE SODIUM PHOSPHATE 10 MG/ML IJ SOLN
INTRAMUSCULAR | Status: AC
  Filled 2022-02-09: qty 1

## 2022-02-09 MED ORDER — ROPIVACAINE HCL 2 MG/ML IJ SOLN
9.0000 mL | Freq: Once | INTRAMUSCULAR | Status: AC
  Administered 2022-02-09: 9 mL via PERINEURAL

## 2022-02-09 NOTE — Patient Instructions (Signed)
____________________________________________________________________________________________  Post-Procedure Discharge Instructions  Instructions: Apply ice:  Purpose: This will minimize any swelling and discomfort after procedure.  When: Day of procedure, as soon as you get home. How: Fill a plastic sandwich bag with crushed ice. Cover it with a small towel and apply to injection site. How long: (15 min on, 15 min off) Apply for 15 minutes then remove x 15 minutes.  Repeat sequence on day of procedure, until you go to bed. Apply heat:  Purpose: To treat any soreness and discomfort from the procedure. When: Starting the next day after the procedure. How: Apply heat to procedure site starting the day following the procedure. How long: May continue to repeat daily, until discomfort goes away. Food intake: Start with clear liquids (like water) and advance to regular food, as tolerated.  Physical activities: Keep activities to a minimum for the first 8 hours after the procedure. After that, then as tolerated. Driving: If you have received any sedation, be responsible and do not drive. You are not allowed to drive for 24 hours after having sedation. Blood thinner: (Applies only to those taking blood thinners) You may restart your blood thinner 6 hours after your procedure. Insulin: (Applies only to Diabetic patients taking insulin) As soon as you can eat, you may resume your normal dosing schedule. Infection prevention: Keep procedure site clean and dry. Shower daily and clean area with soap and water. Post-procedure Pain Diary: Extremely important that this be done correctly and accurately. Recorded information will be used to determine the next step in treatment. For the purpose of accuracy, follow these rules: Evaluate only the area treated. Do not report or include pain from an untreated area. For the purpose of this evaluation, ignore all other areas of pain, except for the treated area. After  your procedure, avoid taking a long nap and attempting to complete the pain diary after you wake up. Instead, set your alarm clock to go off every hour, on the hour, for the initial 8 hours after the procedure. Document the duration of the numbing medicine, and the relief you are getting from it. Do not go to sleep and attempt to complete it later. It will not be accurate. If you received sedation, it is likely that you were given a medication that may cause amnesia. Because of this, completing the diary at a later time may cause the information to be inaccurate. This information is needed to plan your care. Follow-up appointment: Keep your post-procedure follow-up evaluation appointment after the procedure (usually 2 weeks for most procedures, 6 weeks for radiofrequencies). DO NOT FORGET to bring you pain diary with you.   Expect: (What should I expect to see with my procedure?) From numbing medicine (AKA: Local Anesthetics): Numbness or decrease in pain. You may also experience some weakness, which if present, could last for the duration of the local anesthetic. Onset: Full effect within 15 minutes of injected. Duration: It will depend on the type of local anesthetic used. On the average, 1 to 8 hours.  From steroids (Applies only if steroids were used): Decrease in swelling or inflammation. Once inflammation is improved, relief of the pain will follow. Onset of benefits: Depends on the amount of swelling present. The more swelling, the longer it will take for the benefits to be seen. In some cases, up to 10 days. Duration: Steroids will stay in the system x 2 weeks. Duration of benefits will depend on multiple posibilities including persistent irritating factors. Side-effects: If present, they  may typically last 2 weeks (the duration of the steroids). Frequent: Cramps (if they occur, drink Gatorade and take over-the-counter Magnesium 450-500 mg once to twice a day); water retention with temporary  weight gain; increases in blood sugar; decreased immune system response; increased appetite. Occasional: Facial flushing (red, warm cheeks); mood swings; menstrual changes. Uncommon: Long-term decrease or suppression of natural hormones; bone thinning. (These are more common with higher doses or more frequent use. This is why we prefer that our patients avoid having any injection therapies in other practices.)  Very Rare: Severe mood changes; psychosis; aseptic necrosis. From procedure: Some discomfort is to be expected once the numbing medicine wears off. This should be minimal if ice and heat are applied as instructed.  Call if: (When should I call?) You experience numbness and weakness that gets worse with time, as opposed to wearing off. New onset bowel or bladder incontinence. (Applies only to procedures done in the spine)  Emergency Numbers: Durning business hours (Monday - Thursday, 8:00 AM - 4:00 PM) (Friday, 9:00 AM - 12:00 Noon): [phone] After hours: [phone] NOTE: If you are having a problem and are unable connect with, or to talk to a provider, then go to your nearest urgent care or emergency department. If the problem is serious and urgent, please call 911. ____________________________________________________________________________________________  Trigger Point Injection Trigger points are areas where you have pain. A trigger point injection is a shot given in the trigger point to help relieve pain for a few days to a few months. Common places for trigger points include the neck, shoulders, upper back, or lower back. A trigger point injection will not cure long-term (chronic) pain permanently. These injections do not always work for every person. For some people, they can help to relieve pain for a few days to a few months. Tell a health care provider about: Any allergies you have. All medicines you are taking, including vitamins, herbs, eye drops, creams, and  over-the-counter medicines. Any problems you or family members have had with anesthetic medicines. Any bleeding problems you have. Any surgeries you have had. Any medical conditions you have. Whether you are pregnant or may be pregnant. What are the risks? Generally, this is a safe procedure. However, problems may occur, including: Infection. Bleeding or bruising. Allergic reaction to the injected medicine. Irritation of the skin around the injection site. What happens before the procedure? Ask your health care provider about: Changing or stopping your regular medicines. This is especially important if you are taking diabetes medicines or blood thinners. Taking medicines such as aspirin and ibuprofen. These medicines can thin your blood. Do not take these medicines unless your health care provider tells you to take them. Taking over-the-counter medicines, vitamins, herbs, and supplements. What happens during the procedure?  Your health care provider will feel for trigger points. A marker may be used to circle the area for the injection. The skin over the trigger point will be washed with a germ-killing soap. You may be given a medicine to help you relax (sedative). A thin needle is used for the injection. You may feel pain or a twitching feeling when the needle enters your skin. A numbing solution may be injected into the trigger point. Sometimes a medicine to keep down inflammation is also injected. Your health care provider may move the needle around the area where the trigger point is located until the tightness and twitching goes away. After the injection, your health care provider may put gentle pressure  over the injection site. The injection site will be covered with a bandage (dressing). The procedure may vary among health care providers and hospitals. What can I expect after treatment? After treatment, you may have soreness and stiffness for 1-2 days. Follow these instructions  at home: Injection site care Remove your dressing in a few hours, or as told by your health care provider. Check your injection site every day for signs of infection. Check for: Redness, swelling, or pain. Fluid or blood. Warmth. Pus or a bad smell. Managing pain, stiffness, and swelling If directed, put ice on the affected area. To do this: Put ice in a plastic bag. Place a towel between your skin and the bag. Leave the ice on for 20 minutes, 2-3 times a day. Remove the ice if your skin turns bright red. This is very important. If you cannot feel pain, heat, or cold, you have a greater risk of damage to the area. Activity If you were given a sedative during the procedure, it can affect you for several hours. Do not drive or operate machinery until your health care provider says that it is safe. Do not take baths, swim, or use a hot tub until your health care provider approves. Return to your normal activities as told by your health care provider. Ask your health care provider what activities are safe for you. General instructions If you were asked to stop your regular medicines, ask your health care provider when you may start taking them again. You may be asked to see an occupational or physical therapist for exercises that reduce muscle strain and stretch the area of the trigger point. Keep all follow-up visits. This is important. Contact a health care provider if: Your pain comes back, and it is worse than before the injection. You may need more injections. You have chills or a fever. The injection site becomes more painful, red, swollen, or warm to the touch. Summary A trigger point injection is a shot given in the trigger point to help relieve pain. Common places for trigger point injections are the neck, shoulders, upper back, and lower back. These injections do not always work for every person, but for some people, the injections can help to relieve pain for a few days to a few  months. Contact a health care provider if symptoms come back or if they are worse than before treatment. Also, get help if the injection site becomes more painful, red, swollen, or warm to the touch. This information is not intended to replace advice given to you by your health care provider. Make sure you discuss any questions you have with your health care provider. Document Revised: 10/15/2020 Document Reviewed: 10/15/2020 Elsevier Patient Education  [place].

## 2022-02-09 NOTE — Progress Notes (Signed)
PROVIDER NOTE: Interpretation of information contained herein should be left to medically-trained personnel. Specific patient instructions are provided elsewhere under "Patient Instructions" section of medical record. This document was created in part using STT-dictation technology, any transcriptional errors that may result from this process are unintentional.  Patient: Phyllis Smith Type: Established DOB: 1971-05-30 MRN: [id number] PCP: [name], MD  Service: Procedure DOS: 02/09/2022 Setting: Ambulatory Location: Ambulatory outpatient facility Delivery: Face-to-face Provider: Gillis Santa, MD Specialty: Interventional Pain Management Specialty designation: 09 Location: Outpatient facility Ref. Prov.: [name], MD    Primary Reason for Visit: Interventional Pain Management Treatment. CC: Tailbone Pain    Procedure:          Anesthesia, Analgesia, Anxiolysis:  Sacral trigger point injection under fluoroscopy.  Type: Local Anesthesia Local Anesthetic: Lidocaine 1-2% Sedation: None  Indication(s):  Analgesia Route: Infiltration ([place]/IM) IV Access: N/A   Position: Prone   1. Coccydynia   2. Chronic pain syndrome    NAS-11 Pain score:   Pre-procedure: 5 /10   Post-procedure: 5 /10     Pre-op H&P Assessment:  Phyllis Smith is a 51 y.o. (year old), female patient, seen today for interventional treatment. She  has a past surgical history that includes Cesarean section (2001); Appendectomy (1994); Cesarean section (2007); Ankle fracture surgery (Right, 2014); Endometrial biopsy (05/15/2021); Robotic assisted laparoscopic hysterectomy and salpingectomy (N/A, 07/30/2021); and Abdominal hysterectomy. Phyllis Smith has a current medication list which includes the following prescription(s): acetaminophen, estradiol, ibuprofen, gabapentin, and oxycodone. Her primarily concern today is the Tailbone Pain  Initial Vital Signs:  Pulse/HCG Rate: 77ECG Heart Rate: 65 Temp: (!) 97.3 F (36.3  C) Resp: 16 BP: 125/81 SpO2: 99 %  BMI: Estimated body mass index is 29.29 kg/m as calculated from the following:   Height as of this encounter: 5' (1.524 m).   Weight as of this encounter: 150 lb (68 kg).  Risk Assessment: Allergies: Reviewed. She has No Known Allergies.  Allergy Precautions: None required Coagulopathies: Reviewed. None identified.  Blood-thinner therapy: None at this time Active Infection(s): Reviewed. None identified. Phyllis Smith is afebrile  Site Confirmation: Phyllis Smith was asked to confirm the procedure and laterality before marking the site Procedure checklist: Completed Consent: Before the procedure and under the influence of no sedative(s), amnesic(s), or anxiolytics, the patient was informed of the treatment options, risks and possible complications. To fulfill our ethical and legal obligations, as recommended by the American Medical Association's Code of Ethics, I have informed the patient of my clinical impression; the nature and purpose of the treatment or procedure; the risks, benefits, and possible complications of the intervention; the alternatives, including doing nothing; the risk(s) and benefit(s) of the alternative treatment(s) or procedure(s); and the risk(s) and benefit(s) of doing nothing. The patient was provided information about the general risks and possible complications associated with the procedure. These may include, but are not limited to: failure to achieve desired goals, infection, bleeding, organ or nerve damage, allergic reactions, paralysis, and death. In addition, the patient was informed of those risks and complications associated to the procedure, such as failure to decrease pain; infection; bleeding; organ or nerve damage with subsequent damage to sensory, motor, and/or autonomic systems, resulting in permanent pain, numbness, and/or weakness of one or several areas of the body; allergic reactions; (i.e.: anaphylactic reaction); and/or  death. Furthermore, the patient was informed of those risks and complications associated with the medications. These include, but are not limited to: allergic reactions (i.e.: anaphylactic or  anaphylactoid reaction(s)); adrenal axis suppression; blood sugar elevation that in diabetics may result in ketoacidosis or comma; water retention that in patients with history of congestive heart failure may result in shortness of breath, pulmonary edema, and decompensation with resultant heart failure; weight gain; swelling or edema; medication-induced neural toxicity; particulate matter embolism and blood vessel occlusion with resultant organ, and/or nervous system infarction; and/or aseptic necrosis of one or more joints. Finally, the patient was informed that Medicine is not an exact science; therefore, there is also the possibility of unforeseen or unpredictable risks and/or possible complications that may result in a catastrophic outcome. The patient indicated having understood very clearly. We have given the patient no guarantees and we have made no promises. Enough time was given to the patient to ask questions, all of which were answered to the patient's satisfaction. Phyllis Smith has indicated that she wanted to continue with the procedure. Attestation: I, the ordering provider, attest that I have discussed with the patient the benefits, risks, side-effects, alternatives, likelihood of achieving goals, and potential problems during recovery for the procedure that I have provided informed consent. Date  Time: 02/09/2022 11:05 AM  Pre-Procedure Preparation:  Monitoring: As per clinic protocol. Respiration, ETCO2, SpO2, BP, heart rate and rhythm monitor placed and checked for adequate function Safety Precautions: Patient was assessed for positional comfort and pressure points before starting the procedure. Time-out: I initiated and conducted the "Time-out" before starting the procedure, as per protocol. The patient  was asked to participate by confirming the accuracy of the "Time Out" information. Verification of the correct person, site, and procedure were performed and confirmed by me, the nursing staff, and the patient. "Time-out" conducted as per Joint Commission's Universal Protocol (UP.01.01.01). Time: 3664  Description of Procedure:          Area Prepped: Entire             Region DuraPrep (Iodine Povacrylex [0.7% available iodine] and Isopropyl Alcohol, 74% w/w) Safety Precautions: Aspiration looking for blood return was conducted prior to all injections. At no point did we inject any substances, as a needle was being advanced. No attempts were made at seeking any paresthesias. Safe injection practices and needle disposal techniques used. Medications properly checked for expiration dates. SDV (single dose vial) medications used. Description of the Procedure: Protocol guidelines were followed. The patient was placed in position over the fluoroscopy table. The target area was identified and the area prepped in the usual manner. Skin & deeper tissues infiltrated with local anesthetic. Appropriate amount of time allowed to pass for local anesthetics to take effect. The procedure needles were then advanced to the target area. Proper needle placement secured. Negative aspiration confirmed. Solution injected in intermittent fashion, asking for systemic symptoms every 0.5cc of injectate. The needles were then removed and the area cleansed, making sure to leave some of the prepping solution back to take advantage of its long term bactericidal properties.  Vitals:   02/09/22 1110 02/09/22 1124 02/09/22 1131 02/09/22 1135  BP: 125/81 119/70 116/72 119/70  Pulse: 77     Resp: '16 18 18 18  '$ Temp: (!) 97.3 F (36.3 C)     TempSrc: Temporal     SpO2: 99% 99% 99% 97%  Weight: 150 lb (68 kg)     Height: 5' (1.524 m)       Start Time: 1131 hrs. End Time: 1135 hrs. Materials:  Needle(s) Type: Regular needle Gauge:  22G Length: 1.5-in Medication(s): Please see orders for medications  and dosing details.  Point of maximal tenderness to palpation identified at the inferior sacrum with a clamp.  Trigger point injection performed in the inferior sacral region with a 5 cc solution consisting of 4 cc of 0.2% ropivacaine, 1 cc of Decadron 10 mg/cc.  Needling was also performed.  Post-operative Assessment:  Post-procedure Vital Signs:  Pulse/HCG Rate: 7776 Temp:  (!) 97.3 F (36.3 C) Resp: 18 BP: 119/70 SpO2: 97 %  EBL: None  Complications: No immediate post-treatment complications observed by team, or reported by patient.  Note: The patient tolerated the entire procedure well. A repeat set of vitals were taken after the procedure and the patient was kept under observation following institutional policy, for this type of procedure. Post-procedural neurological assessment was performed, showing return to baseline, prior to discharge. The patient was provided with post-procedure discharge instructions, including a section on how to identify potential problems. Should any problems arise concerning this procedure, the patient was given instructions to immediately contact us, at any time, without hesitation. In any case, we plan to contact the patient by telephone for a follow-up status report regarding this interventional procedure.  Comments:  No additional relevant information.  Plan of Care  Orders:  Orders Placed This Encounter  Procedures   DG PAIN [facility] C-ARM 1-60 MIN NO REPORT    Intraoperative interpretation by procedural physician at [facility].    Standing Status:   Standing    Number of Occurrences:   1    Order Specific Question:   Reason for exam:    Answer:   Assistance in needle guidance and placement for procedures requiring needle placement in or near specific anatomical locations not easily accessible without such assistance.    Medications ordered for procedure: Meds  ordered this encounter  Medications   dexamethasone (DECADRON) injection 10 mg   ropivacaine (PF) 2 mg/mL (0.2%) (NAROPIN) injection 9 mL   Medications administered: We administered dexamethasone and ropivacaine (PF) 2 mg/mL (0.2%).  See the medical record for exact dosing, route, and time of administration.  Follow-up plan:   Return in about 4 weeks (around 03/09/2022) for Post Procedure Evaluation, virtual.     Recent Visits Date Type Provider Dept  02/03/22 Office Visit Gillis Santa, MD [facility]  01/07/22 Procedure visit Gillis Santa, MD [facility]  12/24/21 Office Visit Gillis Santa, MD [facility]  12/10/21 Office Visit Gillis Santa, MD [facility]  Showing recent visits within past 90 days and meeting all other requirements Today's Visits Date Type Provider Dept  02/09/22 Procedure visit Gillis Santa, MD [facility]  Showing today's visits and meeting all other requirements Future Appointments Date Type Provider Dept  03/09/22 Appointment Gillis Santa, MD [facility]  Showing future appointments within next 90 days and meeting all other requirements  Disposition: Discharge home  Discharge (Date  Time): 02/09/2022; 1145 hrs.   Primary Care Physician: [name], MD Location: [facility] Note by: Gillis Santa, MD Date: 02/09/2022; Time: 1:15 PM  Disclaimer:  Medicine is not an exact science. The only guarantee in medicine is that nothing is guaranteed. It is important to note that the decision to proceed with this intervention was based on the information collected from the patient. The Data and conclusions were drawn from the patient's questionnaire, the interview, and the physical examination. Because the information was provided in large part by the patient, it cannot be guaranteed that it has not been  purposely or unconsciously manipulated. Every effort has been made to  obtain as much relevant data as possible for this evaluation. It is important to note that the conclusions that lead to this procedure are derived in large part from the available data. Always take into account that the treatment will also be dependent on availability of resources and existing treatment guidelines, considered by other Pain Management Practitioners as being common knowledge and practice, at the time of the intervention. For Medico-Legal purposes, it is also important to point out that variation in procedural techniques and pharmacological choices are the acceptable norm. The indications, contraindications, technique, and results of the above procedure should only be interpreted and judged by a Board-Certified Interventional Pain Specialist with extensive familiarity and expertise in the same exact procedure and technique.

## 2022-02-11 ENCOUNTER — Telehealth: Payer: Self-pay

## 2022-02-11 NOTE — Telephone Encounter (Signed)
Post procedure follow up.  LM 

## 2022-03-06 ENCOUNTER — Telehealth: Payer: Self-pay

## 2022-03-06 NOTE — Telephone Encounter (Signed)
Attempted to call patient for for 8/21 appt. ACE inhibitor therapy was not prescribed due to . Answer. Left message to call us back so we could review her procedure.

## 2022-03-09 ENCOUNTER — Ambulatory Visit
Payer: BC Managed Care – PPO | Attending: Student in an Organized Health Care Education/Training Program | Admitting: Student in an Organized Health Care Education/Training Program

## 2022-03-09 DIAGNOSIS — G894 Chronic pain syndrome: Secondary | ICD-10-CM | POA: Diagnosis not present

## 2022-03-09 DIAGNOSIS — M533 Sacrococcygeal disorders, not elsewhere classified: Secondary | ICD-10-CM | POA: Diagnosis not present

## 2022-03-09 NOTE — Progress Notes (Addendum)
Patient: Phyllis Smith  Service Category: E/M  Provider: Gillis Santa, MD  DOB: 1970/12/04  DOS: 03/09/2022  Location: Office  MRN: [id number]  Setting: Ambulatory outpatient  Referring Provider: [name], MD  Type: Established Patient  Specialty: Interventional Pain Management  PCP: [name], MD  Location: Remote location  Delivery: TeleHealth     Virtual Encounter - Pain Management PROVIDER NOTE: Information contained herein reflects review and annotations entered in association with encounter. Interpretation of such information and data should be left to medically-trained personnel. Information provided to patient can be located elsewhere in the medical record under "Patient Instructions". Document created using STT-dictation technology, any transcriptional errors that may result from process are unintentional.    Contact & Pharmacy Preferred: [phone] Home: [phone] (home) Mobile: [phone] (mobile) E-mail: [email or web address] [place], [place] [address] AT [address] [place] [address] Phone: [phone] Fax: [phone]  [id number] - [place], [address] AT [address] [place] [address] Phone: [phone] Fax: [phone]   Pre-screening  Phyllis Smith offered "in-person" vs "virtual" encounter. She indicated preferring virtual for this encounter.   Reason COVID-19*  Social distancing based on CDC and AMA recommendations.   I contacted Phyllis Smith on 03/09/2022 via telephone.      I clearly identified myself as Gillis Santa, MD. I verified that I was speaking with the correct person using two identifiers (Name: Phyllis Smith, and date of birth: 1970-11-01).  Consent I sought verbal advanced consent from Phyllis Smith for virtual visit interactions. I informed Phyllis Smith of possible security and privacy  concerns, risks, and limitations associated with providing "not-in-person" medical evaluation and management services. I also informed Phyllis Smith of the availability of "in-person" appointments. Finally, I informed her that there would be a charge for the virtual visit and that she could be  personally, fully or partially, financially responsible for it. Phyllis Smith expressed understanding and agreed to proceed.   Historic Elements   Phyllis Smith is a 51 y.o. year old, female patient evaluated today after our last contact on 02/09/2022. Phyllis Smith  has a past medical history of Abnormal uterine bleeding (AUB), Broken ankle, right, closed, initial encounter (2014), History of COVID-19 (01/2020), and Wears glasses. She also  has a past surgical history that includes Cesarean section (2001); Appendectomy (1994); Cesarean section (2007); Ankle fracture surgery (Right, 2014); Endometrial biopsy (05/15/2021); Robotic assisted laparoscopic hysterectomy and salpingectomy (N/A, 07/30/2021); and Abdominal hysterectomy. Phyllis Smith has a current medication list which includes the following prescription(s): acetaminophen, estradiol, ibuprofen, gabapentin, and oxycodone. She  reports that she quit smoking about 11 years ago. Her smoking use included cigarettes and e-cigarettes. She has a 25.00 pack-year smoking history. She has never used smokeless tobacco. She reports current alcohol use of about 8.0 standard drinks of alcohol per week. She reports current drug use. Drug: Marijuana. Phyllis Smith has No Known Allergies.   HPI  Today, she is being contacted for a post-procedure assessment.   Post-procedure evaluation    Procedure:          Anesthesia, Analgesia, Anxiolysis:  Sacral trigger point injection under fluoroscopy.  Type: Local Anesthesia Local Anesthetic: Lidocaine 1-2% Sedation: None  Indication(s):  Analgesia Route: Infiltration ([place]/IM) IV Access: N/A   Position: Prone   1. Coccydynia   2. Chronic  pain syndrome     NAS-11 Pain score:   Pre-procedure: 5 /10   Post-procedure: 5 /10      Effectiveness:  Initial hour after procedure: 100 % Subsequent 4-6 hours post-procedure: 100 %  Analgesia past initial 6 hours: 40 % (procedure helped approx 1 week. then pain gradually returned.)  Ongoing improvement:  Analgesic:  <25% Function: Back to baseline ROM: Back to baseline   Assessment  The primary encounter diagnosis was Coccydynia. A diagnosis of Chronic pain syndrome was also pertinent to this visit.  Plan of Care    Mild benefit from previous TPI.  We will continue to monitor symptoms and repeat as needed.  I also provided instructions on monitoring her low back pain.  Her previous lumbar medial branch ablations were done in March and April respectively.  I informed her that if she notices any increase in her low back pain in the fall or winter months that is sustained to let me know and that we may need to repeat lumbar RFA.  Patient endorsed understanding.   Follow-up plan:   Return if symptoms worsen or fail to improve.     R L3,4,5 RFA 10/15/21, Left L3, 4, 5 RFA 10/29/21, sacral TPI- 01/07/22, 02/09/22    Recent Visits Date Type Provider Dept  02/09/22 Procedure visit Gillis Santa, MD [facility]  02/03/22 Office Visit Gillis Santa, MD [facility]  01/07/22 Procedure visit Gillis Santa, MD [facility]  12/24/21 Office Visit Gillis Santa, MD [facility]  12/10/21 Office Visit Gillis Santa, MD [facility]  Showing recent visits within past 90 days and meeting all other requirements Today's Visits Date Type Provider Dept  03/09/22 Office Visit Gillis Santa, MD [facility]  Showing today's visits and meeting all other requirements Future Appointments No visits were found meeting these conditions. Showing future appointments within next 90 days and meeting all other requirements  I discussed the assessment and treatment  plan with the patient. The patient was provided an opportunity to ask questions and all were answered. The patient agreed with the plan and demonstrated an understanding of the instructions.  Patient advised to call back or seek an in-person evaluation if the symptoms or condition worsens.  Duration of encounter: 57mnutes.  Note by: BGillis Santa MD Date: 03/09/2022; Time: 4:07 PM

## 2022-03-09 NOTE — Addendum Note (Signed)
Addended by: Gillis Santa on: 03/09/2022 04:07 PM   Modules accepted: Level of Service

## 2022-03-24 ENCOUNTER — Encounter: Payer: Self-pay | Admitting: Family Medicine

## 2022-04-09 ENCOUNTER — Other Ambulatory Visit: Payer: Self-pay | Admitting: Student in an Organized Health Care Education/Training Program

## 2022-04-09 DIAGNOSIS — M533 Sacrococcygeal disorders, not elsewhere classified: Secondary | ICD-10-CM

## 2022-04-15 ENCOUNTER — Ambulatory Visit
Admission: RE | Admit: 2022-04-15 | Discharge: 2022-04-15 | Disposition: A | Discharge status: Home / Self Care | Payer: BC Managed Care – PPO | Source: Ambulatory Visit | Attending: Student in an Organized Health Care Education/Training Program | Admitting: Student in an Organized Health Care Education/Training Program

## 2022-04-15 ENCOUNTER — Ambulatory Visit
Payer: BC Managed Care – PPO | Attending: Student in an Organized Health Care Education/Training Program | Admitting: Student in an Organized Health Care Education/Training Program

## 2022-04-15 ENCOUNTER — Encounter: Payer: Self-pay | Admitting: Student in an Organized Health Care Education/Training Program

## 2022-04-15 VITALS — BP 113/82 | HR 64 | Temp 97.2°F | Ht 60.0 in | Wt 150.0 lb

## 2022-04-15 DIAGNOSIS — G894 Chronic pain syndrome: Secondary | ICD-10-CM

## 2022-04-15 DIAGNOSIS — M533 Sacrococcygeal disorders, not elsewhere classified: Secondary | ICD-10-CM

## 2022-04-15 MED ORDER — ROPIVACAINE HCL 2 MG/ML IJ SOLN
9.0000 mL | Freq: Once | INTRAMUSCULAR | Status: AC
  Administered 2022-04-15: 9 mL via PERINEURAL

## 2022-04-15 MED ORDER — ROPIVACAINE HCL 2 MG/ML IJ SOLN
INTRAMUSCULAR | Status: AC
  Filled 2022-04-15: qty 20

## 2022-04-15 MED ORDER — DEXAMETHASONE SODIUM PHOSPHATE 10 MG/ML IJ SOLN
INTRAMUSCULAR | Status: AC
  Filled 2022-04-15: qty 1

## 2022-04-15 MED ORDER — DEXAMETHASONE SODIUM PHOSPHATE 10 MG/ML IJ SOLN
10.0000 mg | Freq: Once | INTRAMUSCULAR | Status: AC
  Administered 2022-04-15: 10 mg

## 2022-04-15 NOTE — Patient Instructions (Signed)

## 2022-04-15 NOTE — Progress Notes (Signed)
PROVIDER NOTE: Interpretation of information contained herein should be left to medically-trained personnel. Specific patient instructions are provided elsewhere under "Patient Instructions" section of medical record. This document was created in part using STT-dictation technology, any transcriptional errors that may result from this process are unintentional.  Patient: Phyllis Smith Type: Established DOB: October 01, 1970 MRN: [id number] PCP: [name], MD  Service: Procedure DOS: 04/15/2022 Setting: Ambulatory Location: Ambulatory outpatient facility Delivery: Face-to-face Provider: Gillis Santa, MD Specialty: Interventional Pain Management Specialty designation: 09 Location: Outpatient facility Ref. Prov.: [name], MD    Primary Reason for Visit: Interventional Pain Management Treatment. CC: Tailbone Pain (mid)    Procedure:          Anesthesia, Analgesia, Anxiolysis:  Sacral trigger point injection under fluoroscopy.  Type: Local Anesthesia Local Anesthetic: Lidocaine 1-2% Sedation: None  Indication(s):  Analgesia Route: Infiltration ([place]/IM) IV Access: N/A   Position: Prone   1. Coccydynia   2. Chronic pain syndrome     NAS-11 Pain score:   Pre-procedure: 7 /10   Post-procedure: 4 /10     Pre-op H&P Assessment:  Phyllis Smith is a 51 y.o. (year old), female patient, seen today for interventional treatment. She  has a past surgical history that includes Cesarean section (2001); Appendectomy (1994); Cesarean section (2007); Ankle fracture surgery (Right, 2014); Endometrial biopsy (05/15/2021); Robotic assisted laparoscopic hysterectomy and salpingectomy (N/A, 07/30/2021); and Abdominal hysterectomy. Phyllis Smith has a current medication list which includes the following prescription(s): acetaminophen, estradiol, ibuprofen, gabapentin, and oxycodone. Her primarily concern today is the Tailbone Pain (mid)  Initial Vital Signs:  Pulse/HCG Rate: 67  Temp: (!) 97.2 F (36.2  C) Resp:   BP: 113/82 SpO2: 100 %  BMI: Estimated body mass index is 29.29 kg/m as calculated from the following:   Height as of this encounter: 5' (1.524 m).   Weight as of this encounter: 150 lb (68 kg).  Risk Assessment: Allergies: Reviewed. She has No Known Allergies.  Allergy Precautions: None required Coagulopathies: Reviewed. None identified.  Blood-thinner therapy: None at this time Active Infection(s): Reviewed. None identified. Phyllis Smith is afebrile  Site Confirmation: Phyllis Smith was asked to confirm the procedure and laterality before marking the site Procedure checklist: Completed Consent: Before the procedure and under the influence of no sedative(s), amnesic(s), or anxiolytics, the patient was informed of the treatment options, risks and possible complications. To fulfill our ethical and legal obligations, as recommended by the American Medical Association's Code of Ethics, I have informed the patient of my clinical impression; the nature and purpose of the treatment or procedure; the risks, benefits, and possible complications of the intervention; the alternatives, including doing nothing; the risk(s) and benefit(s) of the alternative treatment(s) or procedure(s); and the risk(s) and benefit(s) of doing nothing. The patient was provided information about the general risks and possible complications associated with the procedure. These may include, but are not limited to: failure to achieve desired goals, infection, bleeding, organ or nerve damage, allergic reactions, paralysis, and death. In addition, the patient was informed of those risks and complications associated to the procedure, such as failure to decrease pain; infection; bleeding; organ or nerve damage with subsequent damage to sensory, motor, and/or autonomic systems, resulting in permanent pain, numbness, and/or weakness of one or several areas of the body; allergic reactions; (i.e.: anaphylactic reaction); and/or  death. Furthermore, the patient was informed of those risks and complications associated with the medications. These include, but are not limited to: allergic reactions (i.e.:  anaphylactic or anaphylactoid reaction(s)); adrenal axis suppression; blood sugar elevation that in diabetics may result in ketoacidosis or comma; water retention that in patients with history of congestive heart failure may result in shortness of breath, pulmonary edema, and decompensation with resultant heart failure; weight gain; swelling or edema; medication-induced neural toxicity; particulate matter embolism and blood vessel occlusion with resultant organ, and/or nervous system infarction; and/or aseptic necrosis of one or more joints. Finally, the patient was informed that Medicine is not an exact science; therefore, there is also the possibility of unforeseen or unpredictable risks and/or possible complications that may result in a catastrophic outcome. The patient indicated having understood very clearly. We have given the patient no guarantees and we have made no promises. Enough time was given to the patient to ask questions, all of which were answered to the patient's satisfaction. Phyllis Smith has indicated that she wanted to continue with the procedure. Attestation: I, the ordering provider, attest that I have discussed with the patient the benefits, risks, side-effects, alternatives, likelihood of achieving goals, and potential problems during recovery for the procedure that I have provided informed consent. Date  Time: 04/15/2022  8:59 AM  Pre-Procedure Preparation:  Monitoring: As per clinic protocol. Respiration, ETCO2, SpO2, BP, heart rate and rhythm monitor placed and checked for adequate function Safety Precautions: Patient was assessed for positional comfort and pressure points before starting the procedure. Time-out: I initiated and conducted the "Time-out" before starting the procedure, as per protocol. The patient  was asked to participate by confirming the accuracy of the "Time Out" information. Verification of the correct person, site, and procedure were performed and confirmed by me, the nursing staff, and the patient. "Time-out" conducted as per Joint Commission's Universal Protocol (UP.01.01.01). Time: 0944  Description of Procedure:          Area Prepped: Entire       Sacrococcygeal Region DuraPrep (Iodine Povacrylex [0.7% available iodine] and Isopropyl Alcohol, 74% w/w) Safety Precautions: Aspiration looking for blood return was conducted prior to all injections. At no point did we inject any substances, as a needle was being advanced. No attempts were made at seeking any paresthesias. Safe injection practices and needle disposal techniques used. Medications properly checked for expiration dates. SDV (single dose vial) medications used. Description of the Procedure: Protocol guidelines were followed. The patient was placed in position over the fluoroscopy table. The target area was identified and the area prepped in the usual manner. Skin & deeper tissues infiltrated with local anesthetic. Appropriate amount of time allowed to pass for local anesthetics to take effect. The procedure needles were then advanced to the target area. Proper needle placement secured. Negative aspiration confirmed. Solution injected in intermittent fashion, asking for systemic symptoms every 0.5cc of injectate. The needles were then removed and the area cleansed, making sure to leave some of the prepping solution back to take advantage of its long term bactericidal properties.  Vitals:   04/15/22 0921 04/15/22 0947  BP: 113/82   Pulse: 67 64  Temp: (!) 97.2 F (36.2 C)   SpO2: 100% 100%  Weight: 150 lb (68 kg)   Height: 5' (1.524 m)     Start Time: 0944 hrs. End Time: 0947 hrs. Materials:  Needle(s) Type: Regular needle Gauge: 22G Length: 1.5-in Medication(s): Please see orders for medications and dosing  details.  Point of maximal tenderness to palpation identified at the inferior sacrum with a clamp.  Trigger point injection performed in the inferior sacral region with an  8 cc solution consisting of 7 cc of 0.2% ropivacaine, 1 cc of Decadron 10 mg/cc.  Needling was also performed.  Procedure done under fluoroscopy to identify needle tip and to ensure that we were staying over os in the coccyx  Post-operative Assessment:  Post-procedure Vital Signs:  Pulse/HCG Rate: 64  Temp:  (!) 97.2 F (36.2 C) Resp:   BP: 113/82 SpO2: 100 %  EBL: None  Complications: No immediate post-treatment complications observed by team, or reported by patient.  Note: The patient tolerated the entire procedure well. A repeat set of vitals were taken after the procedure and the patient was kept under observation following institutional policy, for this type of procedure. Post-procedural neurological assessment was performed, showing return to baseline, prior to discharge. The patient was provided with post-procedure discharge instructions, including a section on how to identify potential problems. Should any problems arise concerning this procedure, the patient was given instructions to immediately contact us, at any time, without hesitation. In any case, we plan to contact the patient by telephone for a follow-up status report regarding this interventional procedure.  Comments:  No additional relevant information.  Plan of Care  Orders:  Orders Placed This Encounter  Procedures   DG PAIN CLINIC C-ARM 1-60 MIN NO REPORT    Intraoperative interpretation by procedural physician at [place].    Standing Status:   Standing    Number of Occurrences:   1    Order Specific Question:   Reason for exam:    Answer:   Assistance in needle guidance and placement for procedures requiring needle placement in or near specific anatomical locations not easily accessible without such assistance.    Medications  ordered for procedure: Meds ordered this encounter  Medications   ropivacaine (PF) 2 mg/mL (0.2%) (NAROPIN) injection 9 mL   dexamethasone (DECADRON) injection 10 mg   Medications administered: We administered ropivacaine (PF) 2 mg/mL (0.2%) and dexamethasone.  See the medical record for exact dosing, route, and time of administration.  Follow-up plan:   Return if symptoms worsen or fail to improve.     Recent Visits Date Type Provider Dept  03/09/22 Office Visit Gillis Santa, MD [facility]  02/09/22 Procedure visit Gillis Santa, MD [facility]  02/03/22 Office Visit Gillis Santa, MD [facility]  Showing recent visits within past 90 days and meeting all other requirements Today's Visits Date Type Provider Dept  04/15/22 Office Visit Gillis Santa, MD [facility]  Showing today's visits and meeting all other requirements Future Appointments No visits were found meeting these conditions. Showing future appointments within next 90 days and meeting all other requirements  Disposition: Discharge home  Discharge (Date  Time): 04/15/2022; 1000 hrs.   Primary Care Physician: [name], MD Location: [facility] Note by: Gillis Santa, MD Date: 04/15/2022; Time: 9:53 AM  Disclaimer:  Medicine is not an exact science. The only guarantee in medicine is that nothing is guaranteed. It is important to note that the decision to proceed with this intervention was based on the information collected from the patient. The Data and conclusions were drawn from the patient's questionnaire, the interview, and the physical examination. Because the information was provided in large part by the patient, it cannot be guaranteed that it has not been purposely or unconsciously manipulated. Every effort has been made to obtain as much relevant data as possible for this evaluation. It is important to note that the conclusions  that  lead to this procedure are derived in large part from the available data. Always take into account that the treatment will also be dependent on availability of resources and existing treatment guidelines, considered by other Pain Management Practitioners as being common knowledge and practice, at the time of the intervention. For Medico-Legal purposes, it is also important to point out that variation in procedural techniques and pharmacological choices are the acceptable norm. The indications, contraindications, technique, and results of the above procedure should only be interpreted and judged by a Board-Certified Interventional Pain Specialist with extensive familiarity and expertise in the same exact procedure and technique.

## 2022-06-23 ENCOUNTER — Ambulatory Visit (INDEPENDENT_AMBULATORY_CARE_PROVIDER_SITE_OTHER): Payer: BC Managed Care – PPO | Admitting: Family Medicine

## 2022-06-23 ENCOUNTER — Encounter: Payer: Self-pay | Admitting: Family Medicine

## 2022-06-23 VITALS — BP 124/78 | HR 68 | Ht 60.0 in | Wt 160.0 lb

## 2022-06-23 DIAGNOSIS — Z1231 Encounter for screening mammogram for malignant neoplasm of breast: Secondary | ICD-10-CM | POA: Diagnosis not present

## 2022-06-23 DIAGNOSIS — Z1211 Encounter for screening for malignant neoplasm of colon: Secondary | ICD-10-CM

## 2022-06-23 DIAGNOSIS — M7918 Myalgia, other site: Secondary | ICD-10-CM

## 2022-06-23 DIAGNOSIS — Z1159 Encounter for screening for other viral diseases: Secondary | ICD-10-CM

## 2022-06-23 DIAGNOSIS — Z13220 Encounter for screening for lipoid disorders: Secondary | ICD-10-CM

## 2022-06-23 DIAGNOSIS — Z0000 Encounter for general adult medical examination without abnormal findings: Secondary | ICD-10-CM | POA: Diagnosis not present

## 2022-06-23 DIAGNOSIS — G8929 Other chronic pain: Secondary | ICD-10-CM

## 2022-06-23 DIAGNOSIS — M47817 Spondylosis without myelopathy or radiculopathy, lumbosacral region: Secondary | ICD-10-CM

## 2022-06-23 DIAGNOSIS — R7989 Other specified abnormal findings of blood chemistry: Secondary | ICD-10-CM

## 2022-06-23 MED ORDER — DULOXETINE HCL 30 MG PO CPEP: ORAL_CAPSULE | ORAL | 0 refills | Status: DC

## 2022-06-23 NOTE — Assessment & Plan Note (Signed)
Has established with [facility], injections have provided improvement, though short-lived. Has upcoming travel and plans to follow-up for repeat injections. We discussed adjunct treatments and she will initiate duloxetine 30 mg with further titration to 60 mg.

## 2022-06-23 NOTE — Assessment & Plan Note (Signed)
Annual examination completed, risk stratification labs ordered, anticipatory guidance provided.  We will follow labs once resulted. Did order screening mammogram and Cologuard.

## 2022-06-23 NOTE — Progress Notes (Signed)
Annual Physical Exam Visit  Patient Information:  Patient ID: Phyllis Smith, female DOB: [birth year] Age: 51 y.o. MRN: [id number]   Subjective:   CC: Annual Physical Exam  HPI:  Phyllis Smith is here for their annual physical.  I reviewed the past medical history, family history, social history, surgical history, and allergies today and changes were made as necessary.  Please see the problem list section below for additional details.  Past Medical History: Past Medical History:  Diagnosis Date   Abnormal uterine bleeding (AUB)    Broken ankle, right, closed, initial encounter 2014   History of COVID-19 01/2020   mild symptoms all symptosm resolved   Wears glasses    Past Surgical History: Past Surgical History:  Procedure Laterality Date   ABDOMINAL HYSTERECTOMY     ANKLE FRACTURE SURGERY Right 2014   APPENDECTOMY  1994   CESAREAN SECTION  2001   CESAREAN SECTION  2007   ENDOMETRIAL BIOPSY  05/15/2021   ROBOTIC ASSISTED LAPAROSCOPIC HYSTERECTOMY AND SALPINGECTOMY N/A 07/30/2021   Procedure: XI ROBOTIC ASSISTED LAPAROSCOPIC HYSTERECTOMY, BILATERAL SALPINGECTOMY AND LEFT OOPHORECTOMY;  Surgeon: Christophe Louis, MD;  Location: [place];  Service: Gynecology;  Laterality: N/A;  vaginal and abdominal sites   TUBAL LIGATION  04/01/2006   Family History: Family History  Problem Relation Age of Onset   Mesothelioma Mother    Cancer Father    Gallbladder disease Father    Heart disease Maternal Grandmother    Allergies: No Known Allergies Health Maintenance: Health Maintenance  Topic Date Due   Hepatitis C Screening  Never done   COLONOSCOPY (Pts 45-72yr Insurance coverage will need to be confirmed)  Never done   INFLUENZA VACCINE  10/18/2022 (Originally 02/17/2022)   COVID-19 Vaccine (1) 07/09/2026 (Originally 09/16/1971)   HIV Screening  Completed   HPV VACCINES  Aged Out   DTaP/Tdap/Td  Discontinued    HM Colonoscopy          Overdue - COLONOSCOPY  (Pts 45-467yrInsurance coverage will need to be confirmed) (Every 10 Years) Overdue - never done    No completion history exists for this topic.           Medications: Current Outpatient Medications on File Prior to Visit  Medication Sig Dispense Refill   acetaminophen (TYLENOL) 500 MG tablet Take 2 tablets (1,000 mg total) by mouth every 8 (eight) hours as needed. 30 tablet 0   estradiol (CLIMARA - DOSED IN MG/24 HR) 0.05 mg/24hr patch 0.05 mg once a week.     ibuprofen (ADVIL) 600 MG tablet Take 1 tablet (600 mg total) by mouth every 6 (six) hours as needed. 30 tablet 1   gabapentin (NEURONTIN) 100 MG capsule Take 1-3 capsules (100-300 mg total) by mouth at bedtime. Follow written titration schedule. 90 capsule 0   oxyCODONE (OXY IR/ROXICODONE) 5 MG immediate release tablet Take 1-2 tablets (5-10 mg total) by mouth every 4 (four) hours as needed for moderate pain. (Patient not taking: Reported on 12/24/2021) 20 tablet 0   No current facility-administered medications on file prior to visit.    Review of Systems: No headache, visual changes, nausea, vomiting, diarrhea, constipation, dizziness, abdominal pain, skin rash, fevers, chills, night sweats, swollen lymph nodes, weight loss, chest pain, body aches, joint swelling, muscle aches, shortness of breath, mood changes, visual or auditory hallucinations reported.  Objective:   Vitals:   06/23/22 1004  BP: 124/78  Pulse: 68  SpO2: 99%   Vitals:  06/23/22 1004  Weight: 160 lb (72.6 kg)  Height: 5' (1.524 m)   Body mass index is 31.25 kg/m.  General: Well Developed, well nourished, standing due to lumbosacral discomfort.  Neuro: Alert and oriented x3, extra-ocular muscles intact, sensation grossly intact. Cranial nerves II through XII are grossly intact, motor, sensory, and coordinative functions are intact. HEENT: Normocephalic, atraumatic, pupils equal round reactive to light, neck supple, no masses, no lymphadenopathy,  thyroid nonpalpable. Oropharynx, nasopharynx, external ear canals are unremarkable. Skin: Warm and dry, no rashes noted.  Cardiac: Regular rate and rhythm, no murmurs rubs or gallops. No peripheral edema. Pulses symmetric. Respiratory: Clear to auscultation bilaterally. Not using accessory muscles, speaking in full sentences.  Abdominal: Soft, nontender, nondistended, positive bowel sounds, no masses, no organomegaly. Musculoskeletal: Shoulder, elbow, wrist, hip, knee, ankle stable, and with limited range of motion secondary to pain.  Female chaperone initials: KG present throughout the physical examination.  Impression and Recommendations:   The patient was counselled, risk factors were discussed, and anticipatory guidance given.  Problem List Items Addressed This Visit       Musculoskeletal and Integument   Spondylosis of lumbosacral region without myelopathy or radiculopathy    Has established with pain & spine group, injections have provided improvement, though short-lived. Has upcoming travel and plans to follow-up for repeat injections. We discussed adjunct treatments and she will initiate duloxetine 30 mg with further titration to 60 mg.         Other   Annual physical exam - Primary    Annual examination completed, risk stratification labs ordered, anticipatory guidance provided.  We will follow labs once resulted. Did order screening mammogram and Cologuard.      Relevant Orders   CBC   Comprehensive metabolic panel   Hepatitis C antibody   Lipid panel   TSH   Cologuard   MM 3D SCREEN BREAST BILATERAL   VITAMIN D 25 Hydroxy (Vit-D Deficiency, Fractures)   Other Visit Diagnoses     Encounter for screening mammogram for malignant neoplasm of breast       Relevant Orders   MM 3D SCREEN BREAST BILATERAL   Colon cancer screening       Relevant Orders   Cologuard   Need for hepatitis C screening test       Relevant Orders   Hepatitis C antibody   Screening for  lipoid disorders       Relevant Orders   Comprehensive metabolic panel   Lipid panel   Low serum vitamin D       Relevant Orders   VITAMIN D 25 Hydroxy (Vit-D Deficiency, Fractures)   Chronic musculoskeletal pain       Relevant Medications   DULoxetine (CYMBALTA) 30 MG capsule        Orders & Medications Medications:  Meds ordered this encounter  Medications   DULoxetine (CYMBALTA) 30 MG capsule    Sig: Take 1 capsule (30 mg total) by mouth every evening for 14 days, THEN 2 capsules (60 mg total) every evening.    Dispense:  106 capsule    Refill:  0   Orders Placed This Encounter  Procedures   MM 3D SCREEN BREAST BILATERAL   CBC   Comprehensive metabolic panel   Hepatitis C antibody   Lipid panel   TSH   Cologuard   VITAMIN D 25 Hydroxy (Vit-D Deficiency, Fractures)     Return in about 2 months (around 08/24/2022) for follow-up medication.    Earley Abide  Zigmund Daniel, MD, [facility]   Primary Care Sports Medicine Primary Care and Sports Medicine at [facility]

## 2022-06-23 NOTE — Patient Instructions (Signed)
-   Obtain fasting labs with orders provided (can have water or black coffee but otherwise no food or drink x 8 hours before labs) - Review information provided - Attend eye doctor annually, dentist every 6 months, work towards or maintain 30 minutes of moderate intensity physical activity at least 5 days per week, and consume a balanced diet - Return in 2 months - Contact us for any questions between now and then

## 2022-06-24 ENCOUNTER — Telehealth: Payer: Self-pay | Admitting: Student in an Organized Health Care Education/Training Program

## 2022-06-24 NOTE — Telephone Encounter (Signed)
Patient wants to get TPI asap, also wants to see about getting RFA of lower back. I left her a vmail stating Dr Holley Raring is out of office until 18th of Dec. I do not see any orders in her chart for TPI or RFA. I know RFA will have to be authorized.

## 2022-06-24 NOTE — Telephone Encounter (Signed)
I spoke with Juliann Pulse and she states she left patient a voicemail to return call. When she does return call Juliann Pulse is going to schedule her the first available VV with Dr. Holley Raring to discuss TPI and RFA request.

## 2022-06-26 DIAGNOSIS — Z13220 Encounter for screening for lipoid disorders: Secondary | ICD-10-CM | POA: Diagnosis not present

## 2022-06-26 DIAGNOSIS — Z0000 Encounter for general adult medical examination without abnormal findings: Secondary | ICD-10-CM | POA: Diagnosis not present

## 2022-06-26 DIAGNOSIS — R7989 Other specified abnormal findings of blood chemistry: Secondary | ICD-10-CM | POA: Diagnosis not present

## 2022-06-26 DIAGNOSIS — Z1159 Encounter for screening for other viral diseases: Secondary | ICD-10-CM | POA: Diagnosis not present

## 2022-06-27 LAB — COMPREHENSIVE METABOLIC PANEL
ALT: 12 IU/L (ref 0–32)
AST: 14 IU/L (ref 0–40)
Albumin/Globulin Ratio: 1.8 (ref 1.2–2.2)
Albumin: 4.7 g/dL (ref 3.9–4.9)
Alkaline Phosphatase: 111 IU/L (ref 44–121)
BUN/Creatinine Ratio: 28 — ABNORMAL HIGH (ref 9–23)
BUN: 20 mg/dL (ref 6–24)
Bilirubin Total: 0.3 mg/dL (ref 0.0–1.2)
CO2: 22 mmol/L (ref 20–29)
Calcium: 10 mg/dL (ref 8.7–10.2)
Chloride: 100 mmol/L (ref 96–106)
Creatinine, Ser: 0.72 mg/dL (ref 0.57–1.00)
Globulin, Total: 2.6 g/dL (ref 1.5–4.5)
Glucose: 98 mg/dL (ref 70–99)
Potassium: 4.3 mmol/L (ref 3.5–5.2)
Sodium: 140 mmol/L (ref 134–144)
Total Protein: 7.3 g/dL (ref 6.0–8.5)
eGFR: 102 mL/min/{1.73_m2} (ref >59)

## 2022-06-27 LAB — CBC
Hematocrit: 40.1 % (ref 34.0–46.6)
Hemoglobin: 14 g/dL (ref 11.1–15.9)
MCH: 30.1 pg (ref 26.6–33.0)
MCHC: 34.9 g/dL (ref 31.5–35.7)
MCV: 86 fL (ref 79–97)
Platelets: 300 10*3/uL (ref 150–450)
RBC: 4.65 x10E6/uL (ref 3.77–5.28)
RDW: 12.4 % (ref 11.7–15.4)
WBC: 6.2 10*3/uL (ref 3.4–10.8)

## 2022-06-27 LAB — LIPID PANEL
Chol/HDL Ratio: 4.4 ratio (ref 0.0–4.4)
Cholesterol, Total: 257 mg/dL — ABNORMAL HIGH (ref 100–199)
HDL: 59 mg/dL (ref >39)
LDL Chol Calc (NIH): 172 mg/dL — ABNORMAL HIGH (ref 0–99)
Triglycerides: 144 mg/dL (ref 0–149)
VLDL Cholesterol Cal: 26 mg/dL (ref 5–40)

## 2022-06-27 LAB — VITAMIN D 25 HYDROXY (VIT D DEFICIENCY, FRACTURES): Vit D, 25-Hydroxy: 19.8 ng/mL — ABNORMAL LOW (ref 30.0–100.0)

## 2022-06-27 LAB — HEPATITIS C ANTIBODY: Hep C Virus Ab: NONREACTIVE

## 2022-06-27 LAB — TSH: TSH: 2 u[IU]/mL (ref 0.450–4.500)

## 2022-06-30 ENCOUNTER — Other Ambulatory Visit: Payer: Self-pay | Admitting: Family Medicine

## 2022-06-30 DIAGNOSIS — Z1211 Encounter for screening for malignant neoplasm of colon: Secondary | ICD-10-CM | POA: Diagnosis not present

## 2022-06-30 MED ORDER — VITAMIN D (ERGOCALCIFEROL) 1.25 MG (50000 UNIT) PO CAPS: 50000.0000 [IU] | ORAL_CAPSULE | ORAL | 0 refills | Status: DC

## 2022-07-10 LAB — COLOGUARD: COLOGUARD: NEGATIVE

## 2022-08-19 ENCOUNTER — Other Ambulatory Visit: Payer: Self-pay | Admitting: Family Medicine

## 2022-08-19 DIAGNOSIS — M7918 Myalgia, other site: Secondary | ICD-10-CM

## 2022-08-19 NOTE — Telephone Encounter (Signed)
Requested medication (s) are due for refill today: yes  Requested medication (s) are on the active medication list: yes  Last refill:  06/23/22  Future visit scheduled:yes  Notes to clinic:  Unable to refill per protocol, clarify sig      Requested Prescriptions  Pending Prescriptions Disp Refills   DULoxetine (CYMBALTA) 30 MG capsule [Pharmacy Med Name: DULOXETINE DR '30MG'$  CAPSULES] 106 capsule 0    Sig: TAKE 1 CAPSULE(30 MG) BY MOUTH EVERY EVENING FOR 14 DAYS THEN TAKE 2 CAPSULES(60 MG) BY MOUTH EVERY EVENING     Psychiatry: Antidepressants - SNRI - duloxetine Passed - 08/19/2022  3:30 AM      Passed - Cr in normal range and within 360 days    Creatinine, Ser  Date Value Ref Range Status  06/26/2022 0.72 0.57 - 1.00 mg/dL Final         Passed - eGFR is 30 or above and within 360 days    eGFR  Date Value Ref Range Status  06/26/2022 102 >59 mL/min/1.73 Final         Passed - Completed PHQ-2 or PHQ-9 in the last 360 days      Passed - Last BP in normal range    BP Readings from Last 1 Encounters:  06/23/22 124/78         Passed - Valid encounter within last 6 months    Recent Outpatient Visits           1 month ago Annual physical exam   [place] [facility] at [place], Earley Abide, MD   1 year ago Annual physical exam   [place] at [place], Earley Abide, MD   1 year ago Nontraumatic [place] at [place], Earley Abide, MD   1 year ago Nontraumatic [place] at [facility], Earley Abide, MD

## 2022-08-23 ENCOUNTER — Other Ambulatory Visit: Payer: Self-pay | Admitting: Family Medicine

## 2022-08-24 NOTE — Telephone Encounter (Signed)
Requested medication (s) are due for refill today: yes  Requested medication (s) are on the active medication list: yes  Last refill:  06/30/22 #8 0 refills  Future visit scheduled: no  Notes to clinic:  no refills remain. Do you want to refill Rx?     Requested Prescriptions  Pending Prescriptions Disp Refills   Vitamin D, Ergocalciferol, (DRISDOL) 1.25 MG (50000 UNIT) CAPS capsule [Pharmacy Med Name: VITAMIN D2 50,000IU (ERGO) CAP RX] 8 capsule 0    Sig: TAKE ONE CAPSULE BY MOUTH EVERY 7 DAYS FOR 8 TOTAL DOSES     Endocrinology:  Vitamins - Vitamin D Supplementation 2 Failed - 08/23/2022  3:30 AM      Failed - Manual Review: Route requests for 50,000 IU strength to the provider      Failed - Vitamin D in normal range and within 360 days    Vit D, 25-Hydroxy  Date Value Ref Range Status  06/26/2022 19.8 (L) 30.0 - 100.0 ng/mL Final    Comment:    Vitamin D deficiency has been defined by the [place] practice guideline as a level of serum 25-OH vitamin D less than 20 ng/mL (1,2). The Endocrine Society went on to further define vitamin D insufficiency as a level between 21 and 29 ng/mL (2). 1. IOM (Institute of Medicine). 2010. Dietary reference    intakes for calcium and D. [place]: The    [employer]. 2. Holick MF, Binkley [place], Bischoff-Ferrari HA, et al.    Evaluation, treatment, and prevention of vitamin D    deficiency: an Endocrine Society clinical practice    guideline. JCEM. 2011 Jul; 96(7):1911-30.          Passed - Ca in normal range and within 360 days    Calcium  Date Value Ref Range Status  06/26/2022 10.0 8.7 - 10.2 mg/dL Final         Passed - Valid encounter within last 12 months    Recent Outpatient Visits           2 months ago Annual physical exam   [place] [facility] at [place], Earley Abide, MD   1 year ago Annual physical exam   [place] at [facility], Earley Abide, MD   1 year ago Nontraumatic coccydynia   [place] at [place], Earley Abide, MD   1 year ago Nontraumatic [place] at [facility], Earley Abide, MD

## 2022-09-01 ENCOUNTER — Other Ambulatory Visit: Payer: Self-pay | Admitting: Family Medicine

## 2022-09-01 DIAGNOSIS — G8929 Other chronic pain: Secondary | ICD-10-CM

## 2022-09-08 IMAGING — CR DG LUMBAR SPINE COMPLETE 4+V
5 series · 5 of 5 positions shown · non-contrast
Comparison: None.

CLINICAL DATA: Acute on chronic low back pain.

EXAM:
LUMBAR SPINE - COMPLETE 4+ VIEW

[l-spine ap]
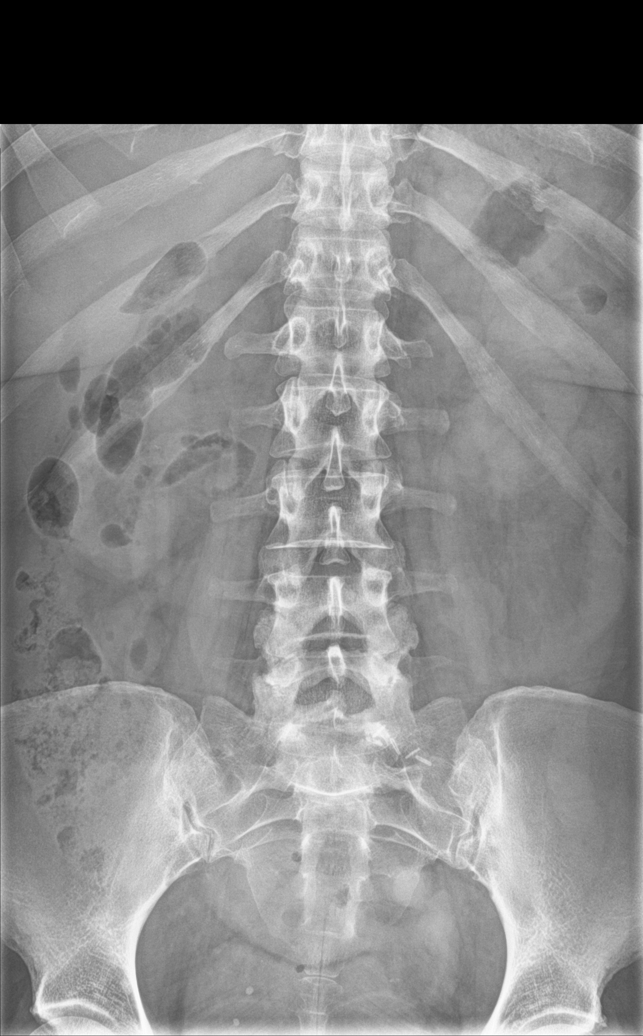

[l-spine obl (1 of 2)]
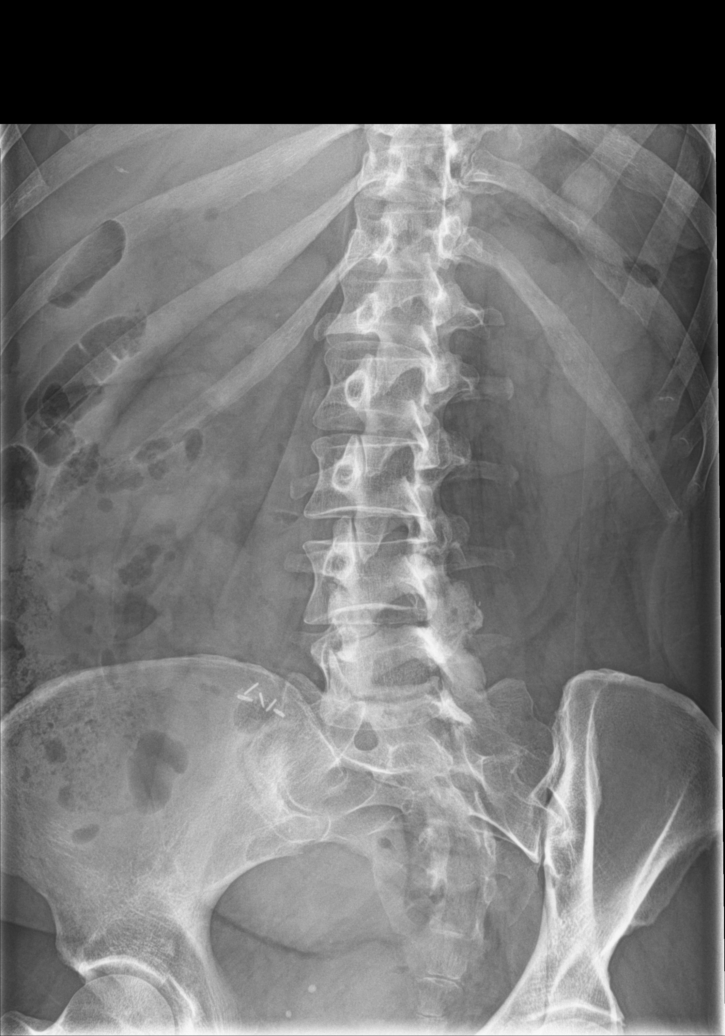

[l-spine obl (2 of 2)]
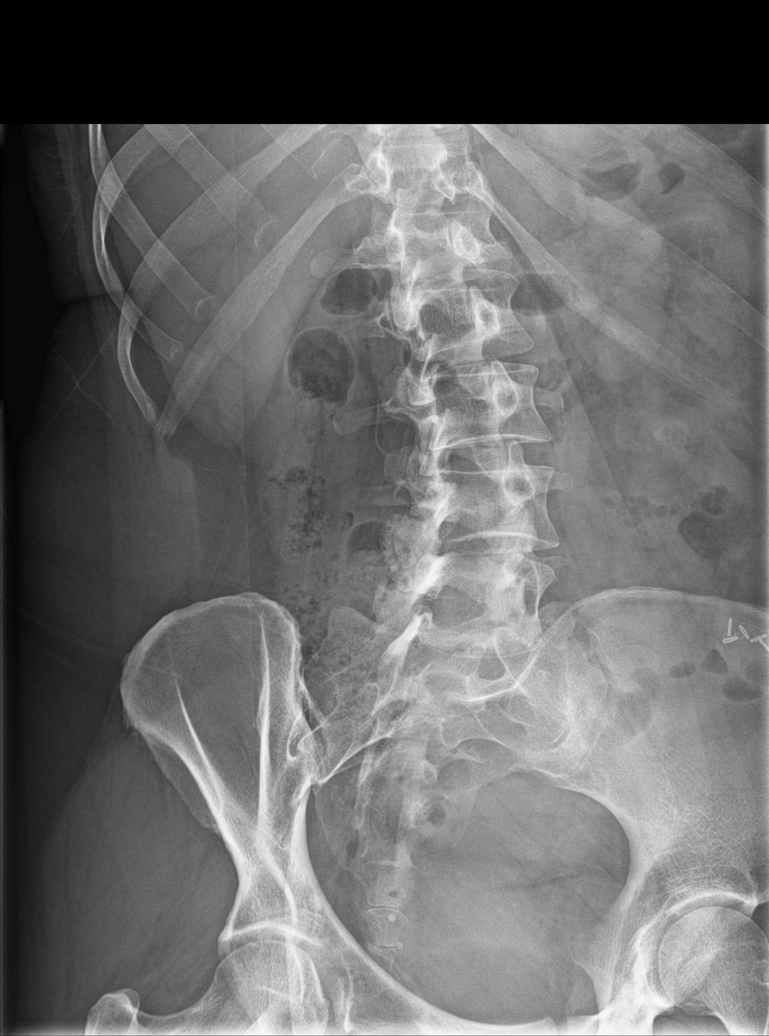

[l-spine lat]
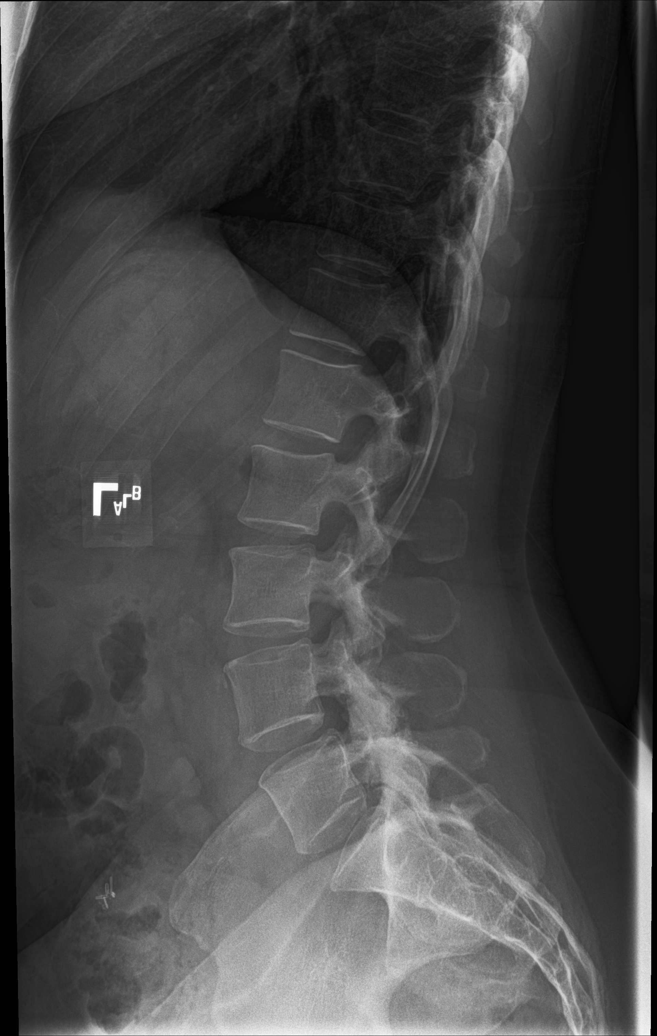

[l-spine spot]
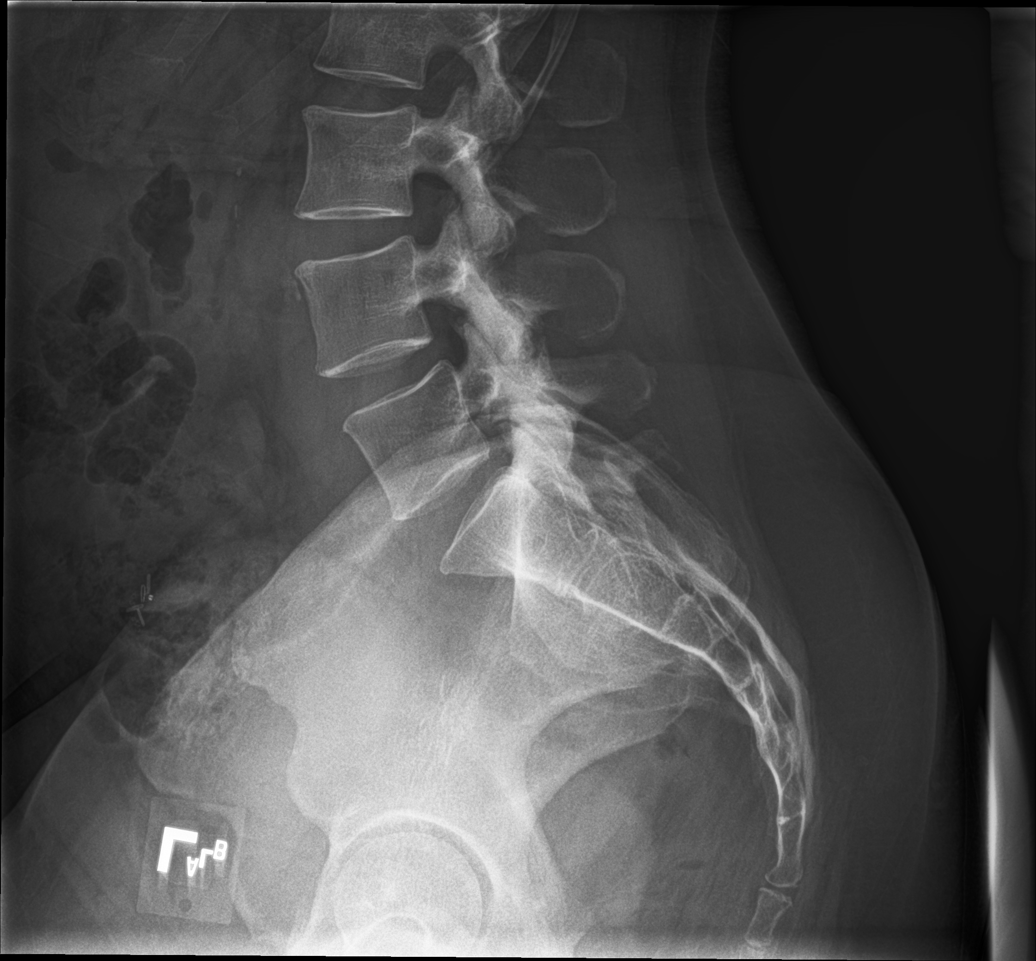

[5 of 5 positions shown; findings below may reference images not displayed]

FINDINGS: Five lumbar type vertebral bodies.

No acute fracture or subluxation. Vertebral body heights are
preserved.

Trace anterolisthesis at L4-L5.

Intervertebral disc spaces are maintained. Severe L4-L5 and moderate
L5-S1 facet arthropathy.

The sacroiliac joints are unremarkable.
IMPRESSION: 1. Lower lumbar facet arthropathy, severe at L4-L5.

## 2022-09-08 IMAGING — CR DG SACRUM/COCCYX 2+V
5 series · 5 of 5 positions shown · non-contrast
Comparison: None.

CLINICAL DATA: Chronic coccydynia.  Concern for hypermobility.

EXAM:
SACRUM AND COCCYX - 2+ VIEW

[coccyx ap]
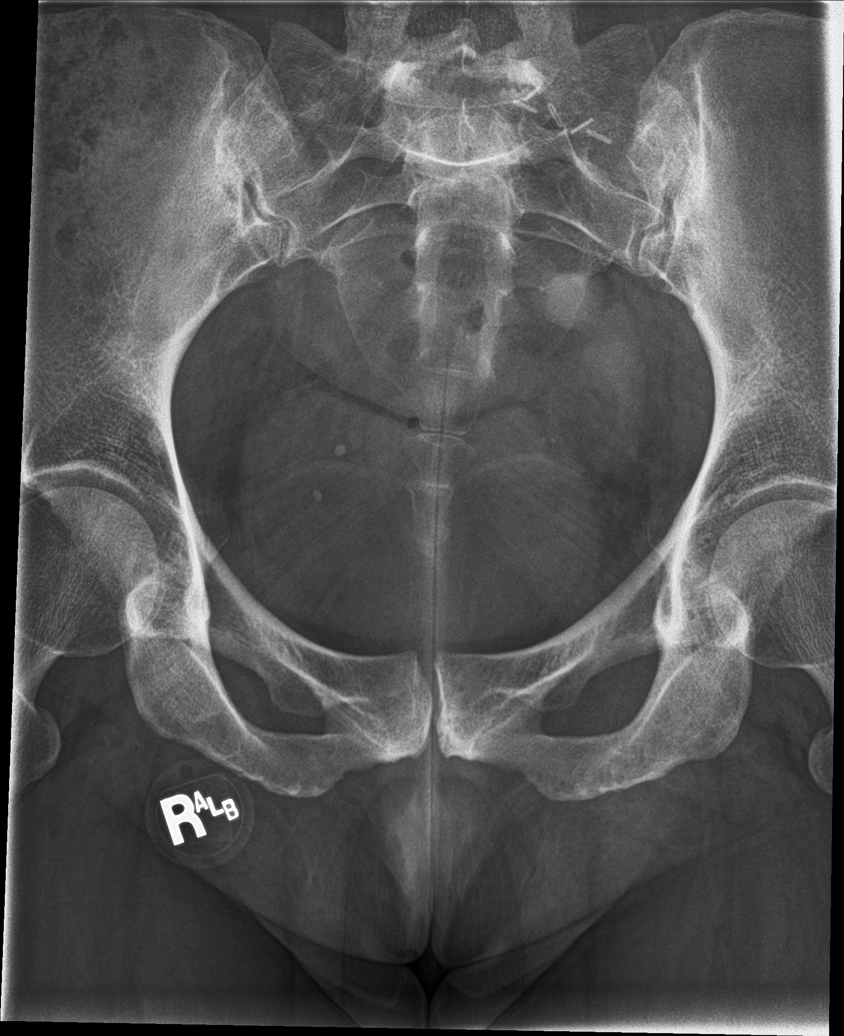

[sacrum ap]
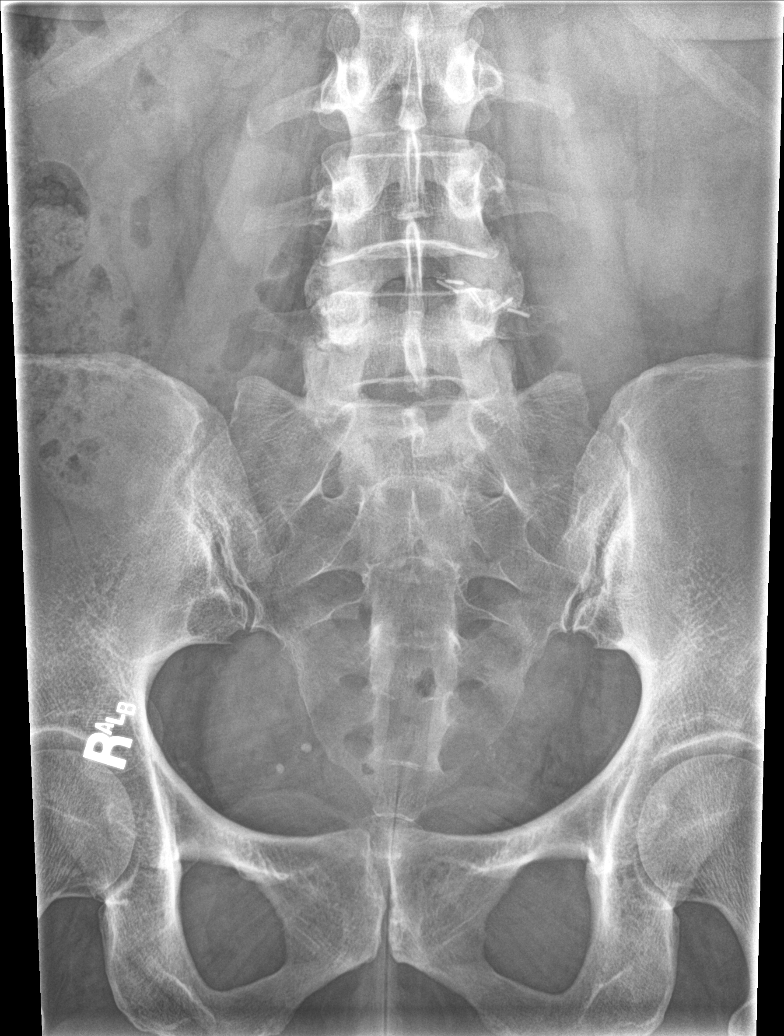

[sacrum lat (1 of 3)]
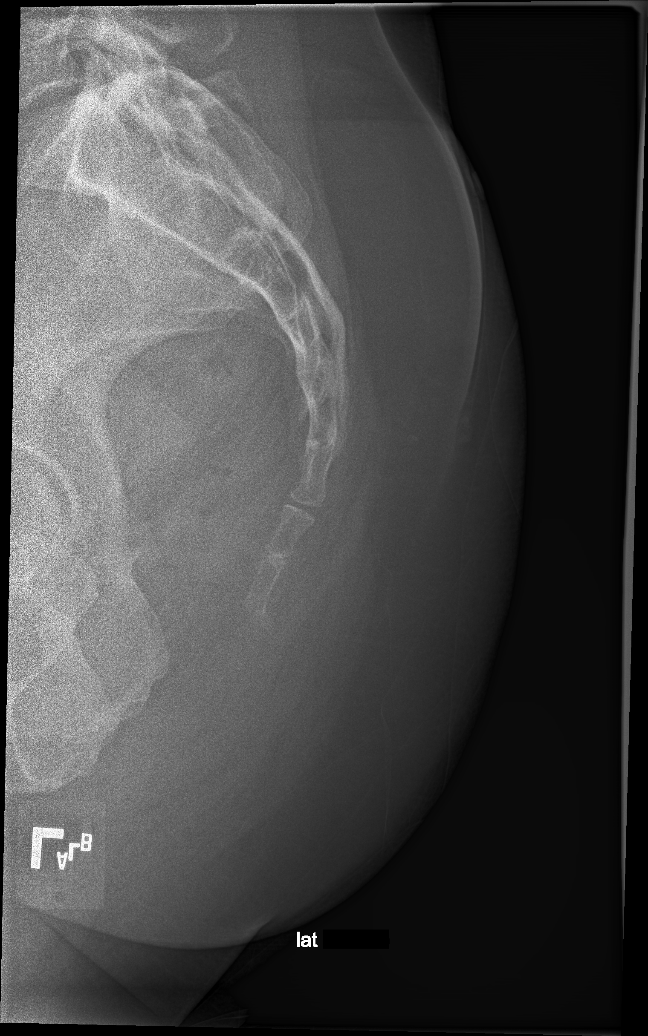

[sacrum lat (2 of 3)]
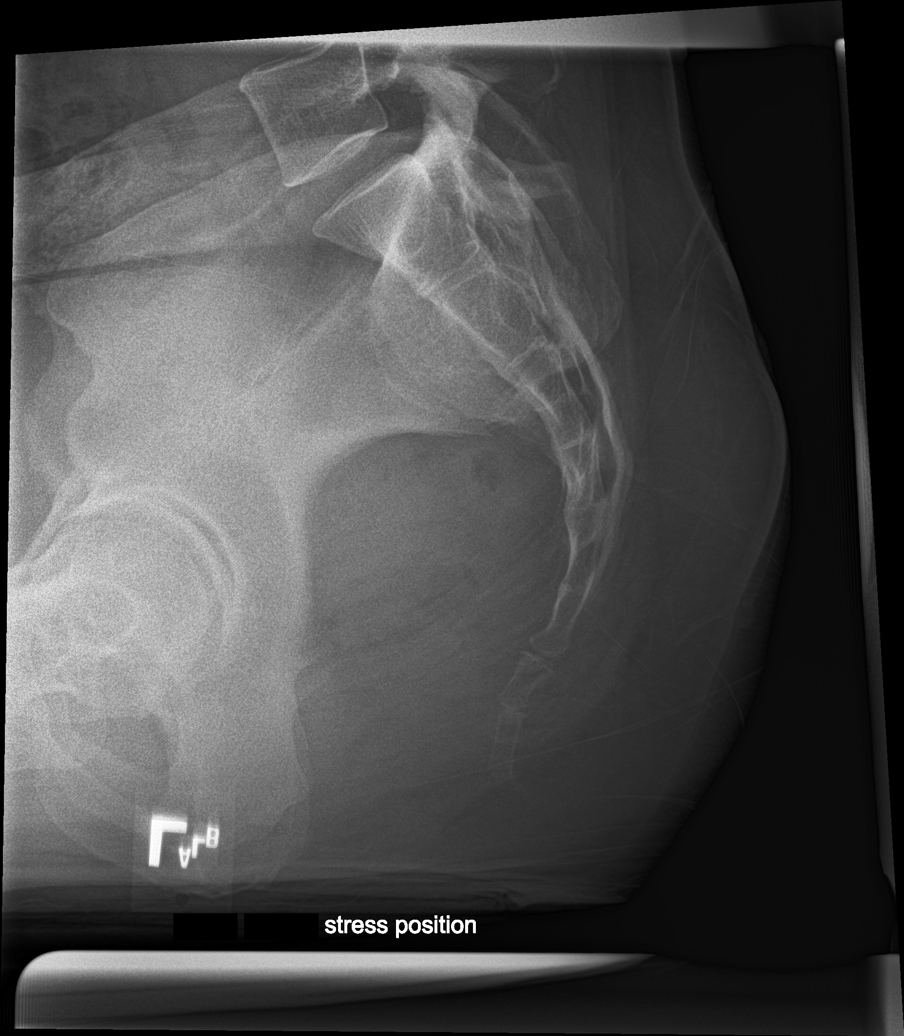

[sacrum lat (3 of 3)]
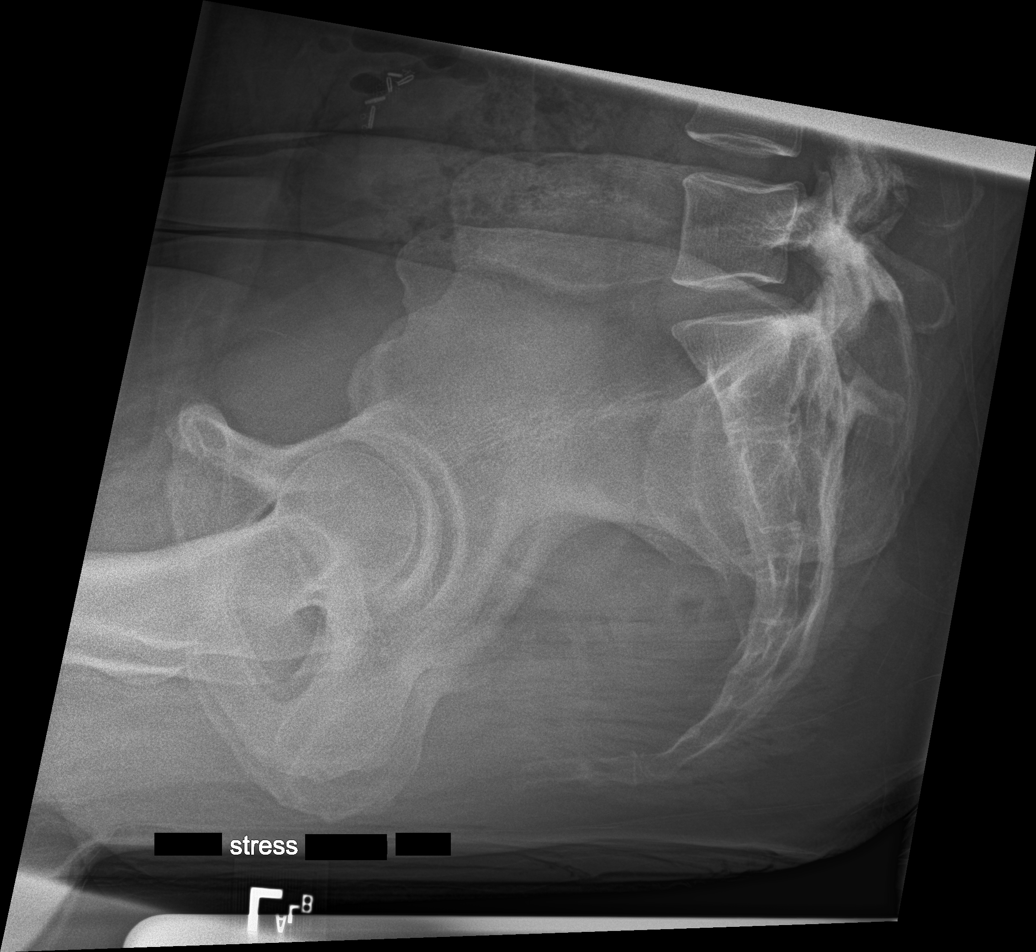

[5 of 5 positions shown; findings below may reference images not displayed]

FINDINGS: Mildly increased posterior subluxation of the distal coccyx when
sitting compared to standing, measuring 18 degrees. No significant
change in flexion of the distal coccyx when sitting.

No acute fracture or dislocation. The sacroiliac joints and pubic
symphysis are unremarkable. Bone mineralization is normal. Soft
tissues are unremarkable.
IMPRESSION: 1. Mildly increased posterior subluxation of the distal coccyx when
sitting, measuring 18 degrees, which does not meet criteria for
hypermobility (>25 degrees).
2. No acute osseous abnormality.

## 2022-09-09 NOTE — Telephone Encounter (Signed)
Patient states that the medication is working great and would like to continue taking it.Patient stated she changed jobs and her new insurance does not start until April. Patient is scheduled for visit on 4/2. Please advise.

## 2022-10-20 ENCOUNTER — Encounter: Payer: Self-pay | Admitting: Family Medicine

## 2022-10-20 ENCOUNTER — Ambulatory Visit: Payer: 59 | Admitting: Family Medicine

## 2022-10-20 VITALS — BP 110/70 | HR 86 | Ht 60.0 in | Wt 150.0 lb

## 2022-10-20 DIAGNOSIS — M533 Sacrococcygeal disorders, not elsewhere classified: Secondary | ICD-10-CM

## 2022-10-20 DIAGNOSIS — G8929 Other chronic pain: Secondary | ICD-10-CM | POA: Diagnosis not present

## 2022-10-20 DIAGNOSIS — M7918 Myalgia, other site: Secondary | ICD-10-CM

## 2022-10-20 MED ORDER — DULOXETINE HCL 30 MG PO CPEP: 60.0000 mg | ORAL_CAPSULE | Freq: Every day | ORAL | 2 refills | Status: DC

## 2022-10-20 NOTE — Assessment & Plan Note (Addendum)
See additional assessment(s) for plan details. 

## 2022-10-20 NOTE — Patient Instructions (Signed)
-   Restart duloxetine (Cymbalta) 30 mg daily x 7 days - After 7 days, increase duloxetine (Cymbalta) to 60 mg daily until follow-up - Review information attached and make appropriate changes were able to do so - Return for follow-up in 3 months, contact us for any questions/concerns over the interim

## 2022-10-20 NOTE — Assessment & Plan Note (Signed)
Returns for follow-up to chronic pain with primary focality to the coccyx, also describes bilateral hip pain.  Has had extensive treatments through pain and spine group with beneficial, though short-lived, response.  Was started on Cymbalta at last visit, Rx expired, did note benefit while on medication. Did discuss the noted benefit from Cymbalta and scattered pain, recalcitrant symptoms to prior treatments, all raising concern for conditions such as fibromyalgia and myofascial pain syndrome.  Plan as follows: - Start Cymbalta 30 mg x 1 week then 60 mg daily - Adjunct lifestyle modifications encouraged with patient information provided - Return in 3 months for reevaluation, consideration of augmentation or adjunct medications / treatments

## 2022-10-20 NOTE — Progress Notes (Signed)
     Primary Care / Sports Medicine Office Visit  Patient Information:  Patient ID: Phyllis Smith, female DOB: [birth year] Age: 52 y.o. MRN: [id number]   Phyllis Smith is a pleasant 52 y.o. female presenting with the following:  Chief Complaint  Patient presents with   Nontraumatic coccydynia    Vitals:   10/20/22 1605  BP: 110/70  Pulse: 86  SpO2: 97%   Vitals:   10/20/22 1605  Weight: 150 lb (68 kg)  Height: 5' (1.524 m)   Body mass index is 29.29 kg/m.  No results found.   Independent interpretation of notes and tests performed by another provider:   None  Procedures performed:   None  Pertinent History, Exam, Impression, and Recommendations:   Phyllis Smith was seen today for nontraumatic coccydynia.  Chronic musculoskeletal pain Assessment & Plan: Returns for follow-up to chronic pain with primary focality to the coccyx, also describes bilateral hip pain.  Has had extensive treatments through pain and spine group with beneficial, though short-lived, response.  Was started on Cymbalta at last visit, Rx expired, did note benefit while on medication. Did discuss the noted benefit from Cymbalta and scattered pain, recalcitrant symptoms to prior treatments, all raising concern for conditions such as fibromyalgia and myofascial pain syndrome.  Plan as follows: - Start Cymbalta 30 mg x 1 week then 60 mg daily - Adjunct lifestyle modifications encouraged with patient information provided - Return in 3 months for reevaluation, consideration of augmentation or adjunct medications / treatments  Orders: -     DULoxetine HCl; Take 2 capsules (60 mg total) by mouth daily.  Dispense: 60 capsule; Refill: 2  Nontraumatic coccydynia Assessment & Plan: See additional assessment(s) for plan details.      Orders & Medications Meds ordered this encounter  Medications   DULoxetine (CYMBALTA) 30 MG capsule    Sig: Take 2 capsules (60 mg total) by mouth daily.    Dispense:  60  capsule    Refill:  2   No orders of the defined types were placed in this encounter.    Return in about 3 months (around 01/19/2023).     [name], MD, [facility]   Primary Care Sports Medicine Primary Care and Sports Medicine at [facility]

## 2023-01-19 ENCOUNTER — Ambulatory Visit: Payer: 59 | Admitting: Family Medicine

## 2023-01-19 ENCOUNTER — Encounter: Payer: Self-pay | Admitting: Family Medicine

## 2023-01-19 VITALS — BP 100/60 | HR 72 | Ht 60.0 in | Wt 139.0 lb

## 2023-01-19 DIAGNOSIS — M7918 Myalgia, other site: Secondary | ICD-10-CM | POA: Diagnosis not present

## 2023-01-19 DIAGNOSIS — G8929 Other chronic pain: Secondary | ICD-10-CM

## 2023-01-29 NOTE — Progress Notes (Signed)
     Primary Care / Sports Medicine Office Visit  Patient Information:  Patient ID: DEMPLE CISSE, female DOB: [birth year] Age: 52 y.o. MRN: [id number]   Phyllis Smith is a pleasant 52 y.o. female presenting with the following:  Chief Complaint  Patient presents with   Chronic musculoskeletal pain    Vitals:   01/19/23 0835  BP: 100/60  Pulse: 72  SpO2: 99%   Vitals:   01/19/23 0835  Weight: 139 lb (63 kg)  Height: 5' (1.524 m)   Body mass index is 27.15 kg/m.  No results found.   Independent interpretation of notes and tests performed by another provider:   None  Procedures performed:   None  Pertinent History, Exam, Impression, and Recommendations:   Phyllis Smith was seen today for chronic musculoskeletal pain.  Chronic musculoskeletal pain     Orders & Medications No orders of the defined types were placed in this encounter.  No orders of the defined types were placed in this encounter.    No follow-ups on file.     Jerrol Banana, MD, [facility]   Primary Care Sports Medicine Primary Care and Sports Medicine at [facility]

## 2023-01-29 NOTE — Assessment & Plan Note (Signed)
Did manage to titrate to 60 mg of Cymbalta, tolerating very well and noting significant symptom control.  Plan as follows: - Continue 60 mg Cymbalta daily - Can contact for dose adjustment - Follow peripherally on this issue

## 2023-01-31 ENCOUNTER — Other Ambulatory Visit: Payer: Self-pay | Admitting: Family Medicine

## 2023-01-31 DIAGNOSIS — M7918 Myalgia, other site: Secondary | ICD-10-CM

## 2023-02-01 NOTE — Telephone Encounter (Signed)
Requested Prescriptions  Pending Prescriptions Disp Refills   DULoxetine (CYMBALTA) 30 MG capsule [Pharmacy Med Name: DULOXETINE DR 30MG  CAPSULES] 180 capsule 1    Sig: TAKE 2 CAPSULES(60 MG) BY MOUTH DAILY     Psychiatry: Antidepressants - SNRI - duloxetine Passed - 01/31/2023  3:30 AM      Passed - Cr in normal range and within 360 days    Creatinine, Ser  Date Value Ref Range Status  06/26/2022 0.72 0.57 - 1.00 mg/dL Final         Passed - eGFR is 30 or above and within 360 days    eGFR  Date Value Ref Range Status  06/26/2022 102 >59 mL/min/1.73 Final         Passed - Completed PHQ-2 or PHQ-9 in the last 360 days      Passed - Last BP in normal range    BP Readings from Last 1 Encounters:  01/19/23 100/60         Passed - Valid encounter within last 6 months    Recent Outpatient Visits           1 week ago Chronic musculoskeletal pain   [place] [facility] at [facility] Royalty, Ocie Bob, MD   3 months ago Chronic musculoskeletal pain   [place] [facility], Ocie Bob, MD   7 months ago Annual physical exam   [facility] at [facility], Ocie Bob, MD   1 year ago Annual physical exam   [facility] at [facility], Ocie Bob, MD   1 year ago Nontraumatic coccydynia   [facility] at [facility], Ocie Bob, MD       Future Appointments             In 4 months [facility] Royalty, Ocie Bob, MD [facility] Primary Care & Sports Medicine at [facility], [facility]

## 2023-06-29 ENCOUNTER — Ambulatory Visit: Payer: 59 | Admitting: Family Medicine

## 2023-08-09 ENCOUNTER — Other Ambulatory Visit: Payer: Self-pay | Admitting: Family Medicine

## 2023-08-09 DIAGNOSIS — M7918 Myalgia, other site: Secondary | ICD-10-CM

## 2023-08-09 NOTE — Telephone Encounter (Signed)
Requested medication (s) are due for refill today: yes  Requested medication (s) are on the active medication list: yes  Last refill:  02/01/23  Future visit scheduled: no  Notes to clinic:  Unable to refill per protocol due to failed labs, no updated results.      Requested Prescriptions  Pending Prescriptions Disp Refills   DULoxetine (CYMBALTA) 30 MG capsule [Pharmacy Med Name: DULOXETINE DR 30MG  CAPSULES] 180 capsule 1    Sig: TAKE 2 CAPSULES BY MOUTH DAILY     Psychiatry: Antidepressants - SNRI - duloxetine Failed - 08/09/2023  9:29 AM      Failed - Cr in normal range and within 360 days    Creatinine, Ser  Date Value Ref Range Status  06/26/2022 0.72 0.57 - 1.00 mg/dL Final         Failed - eGFR is 30 or above and within 360 days    eGFR  Date Value Ref Range Status  06/26/2022 102 >59 mL/min/1.73 Final         Failed - Completed PHQ-2 or PHQ-9 in the last 360 days      Failed - Valid encounter within last 6 months    Recent Outpatient Visits           6 months ago Chronic musculoskeletal pain   [place] [facility] at [facility] Royalty, Ocie Bob, MD   9 months ago Chronic musculoskeletal pain   [place] [facility] at [facility], Ocie Bob, MD   1 year ago Annual physical exam   [facility] at [facility], Ocie Bob, MD   2 years ago Annual physical exam   [facility] at [facility], Ocie Bob, MD   2 years ago Nontraumatic coccydynia   [facility] Care [facility] Medicine at [facility], Ocie Bob, MD              Passed - Last BP in normal range    BP Readings from Last 1 Encounters:  01/19/23 100/60

## 2024-02-28 ENCOUNTER — Other Ambulatory Visit: Payer: Self-pay

## 2024-02-28 DIAGNOSIS — G8929 Other chronic pain: Secondary | ICD-10-CM

## 2024-02-28 MED ORDER — DULOXETINE HCL 30 MG PO CPEP: 60.0000 mg | ORAL_CAPSULE | Freq: Every day | ORAL | 1 refills | Status: AC
